# Patient Record
Sex: Male | Born: 1955 | Race: White | Hispanic: No | Marital: Married | Smoking: Current every day smoker
Health system: Southern US, Community
[De-identification: ages and names within clinical notes are randomized; demographics above are authoritative.]

## PROBLEM LIST (undated history)

## (undated) DIAGNOSIS — G8929 Other chronic pain: Secondary | ICD-10-CM

## (undated) DIAGNOSIS — F419 Anxiety disorder, unspecified: Secondary | ICD-10-CM

## (undated) DIAGNOSIS — M069 Rheumatoid arthritis, unspecified: Secondary | ICD-10-CM

## (undated) DIAGNOSIS — K2289 Other specified disease of esophagus: Secondary | ICD-10-CM

## (undated) DIAGNOSIS — I639 Cerebral infarction, unspecified: Secondary | ICD-10-CM

## (undated) DIAGNOSIS — J449 Chronic obstructive pulmonary disease, unspecified: Secondary | ICD-10-CM

## (undated) DIAGNOSIS — I999 Unspecified disorder of circulatory system: Secondary | ICD-10-CM

## (undated) DIAGNOSIS — I10 Essential (primary) hypertension: Secondary | ICD-10-CM

## (undated) DIAGNOSIS — M549 Dorsalgia, unspecified: Secondary | ICD-10-CM

## (undated) DIAGNOSIS — K228 Other specified diseases of esophagus: Secondary | ICD-10-CM

## (undated) DIAGNOSIS — R001 Bradycardia, unspecified: Secondary | ICD-10-CM

## (undated) HISTORY — PX: FOOT SURGERY: SHX648

## (undated) HISTORY — PX: STOMACH SURGERY: SHX791

## (undated) HISTORY — DX: Anxiety disorder, unspecified: F41.9

## (undated) HISTORY — DX: Unspecified disorder of circulatory system: I99.9

## (undated) HISTORY — DX: Bradycardia, unspecified: R00.1

## (undated) HISTORY — DX: Cerebral infarction, unspecified: I63.9

## (undated) HISTORY — DX: Other chronic pain: G89.29

## (undated) HISTORY — DX: Other specified diseases of esophagus: K22.8

## (undated) HISTORY — PX: BACK SURGERY: SHX140

## (undated) HISTORY — DX: Chronic obstructive pulmonary disease, unspecified: J44.9

## (undated) HISTORY — DX: Other chronic pain: M54.9

## (undated) HISTORY — DX: Other specified disease of esophagus: K22.89

## (undated) HISTORY — DX: Essential (primary) hypertension: I10

## (undated) HISTORY — PX: NASAL SINUS SURGERY: SHX719

## (undated) HISTORY — DX: Rheumatoid arthritis, unspecified: M06.9

---

## 2003-05-13 ENCOUNTER — Other Ambulatory Visit: Payer: Self-pay

## 2003-11-28 ENCOUNTER — Emergency Department: Payer: Self-pay | Admitting: Emergency Medicine

## 2003-11-29 ENCOUNTER — Emergency Department: Payer: Self-pay | Admitting: General Practice

## 2004-12-05 ENCOUNTER — Ambulatory Visit: Payer: Self-pay | Admitting: Pain Medicine

## 2004-12-11 ENCOUNTER — Ambulatory Visit: Payer: Self-pay | Admitting: Pain Medicine

## 2005-01-02 ENCOUNTER — Ambulatory Visit: Payer: Self-pay | Admitting: Pain Medicine

## 2005-01-08 ENCOUNTER — Ambulatory Visit: Payer: Self-pay | Admitting: Pain Medicine

## 2005-02-06 ENCOUNTER — Ambulatory Visit: Payer: Self-pay | Admitting: Pain Medicine

## 2005-02-14 ENCOUNTER — Ambulatory Visit: Payer: Self-pay | Admitting: Pain Medicine

## 2005-03-08 ENCOUNTER — Ambulatory Visit: Payer: Self-pay | Admitting: Pain Medicine

## 2005-03-10 ENCOUNTER — Emergency Department: Payer: Self-pay | Admitting: Emergency Medicine

## 2005-03-19 ENCOUNTER — Ambulatory Visit: Payer: Self-pay | Admitting: Pain Medicine

## 2005-04-05 ENCOUNTER — Ambulatory Visit: Payer: Self-pay | Admitting: Pain Medicine

## 2005-04-09 ENCOUNTER — Ambulatory Visit: Payer: Self-pay | Admitting: Pain Medicine

## 2005-04-26 ENCOUNTER — Other Ambulatory Visit: Payer: Self-pay

## 2005-05-03 ENCOUNTER — Inpatient Hospital Stay: Payer: Self-pay | Admitting: Unknown Physician Specialty

## 2005-05-04 ENCOUNTER — Other Ambulatory Visit: Payer: Self-pay

## 2005-05-14 ENCOUNTER — Ambulatory Visit: Payer: Self-pay | Admitting: Family Medicine

## 2006-03-28 ENCOUNTER — Other Ambulatory Visit: Payer: Self-pay

## 2006-03-28 ENCOUNTER — Inpatient Hospital Stay: Payer: Self-pay | Admitting: Internal Medicine

## 2006-04-05 ENCOUNTER — Ambulatory Visit: Payer: Self-pay | Admitting: Internal Medicine

## 2006-04-11 ENCOUNTER — Ambulatory Visit: Payer: Self-pay | Admitting: Internal Medicine

## 2006-04-18 ENCOUNTER — Ambulatory Visit: Payer: Self-pay | Admitting: Internal Medicine

## 2006-04-27 ENCOUNTER — Ambulatory Visit: Payer: Self-pay | Admitting: Internal Medicine

## 2006-05-24 ENCOUNTER — Emergency Department: Payer: Self-pay | Admitting: Emergency Medicine

## 2006-05-28 ENCOUNTER — Ambulatory Visit: Payer: Self-pay | Admitting: Internal Medicine

## 2006-06-27 ENCOUNTER — Ambulatory Visit: Payer: Self-pay | Admitting: Internal Medicine

## 2006-07-06 ENCOUNTER — Other Ambulatory Visit: Payer: Self-pay

## 2006-07-06 ENCOUNTER — Inpatient Hospital Stay: Payer: Self-pay | Admitting: Internal Medicine

## 2006-07-28 ENCOUNTER — Ambulatory Visit: Payer: Self-pay | Admitting: Internal Medicine

## 2006-08-02 ENCOUNTER — Ambulatory Visit: Payer: Self-pay | Admitting: Internal Medicine

## 2006-08-09 ENCOUNTER — Ambulatory Visit: Payer: Self-pay | Admitting: Internal Medicine

## 2006-08-27 ENCOUNTER — Ambulatory Visit: Payer: Self-pay | Admitting: Internal Medicine

## 2006-09-27 ENCOUNTER — Ambulatory Visit: Payer: Self-pay | Admitting: Internal Medicine

## 2007-02-24 ENCOUNTER — Emergency Department: Payer: Self-pay | Admitting: Emergency Medicine

## 2007-03-05 IMAGING — CR DG CHEST 1V PORT
1 series · 1 of 1 positions shown · non-contrast
Comparison: none

REASON FOR EXAM: Chest Pain
COMMENTS:

[view not recorded]
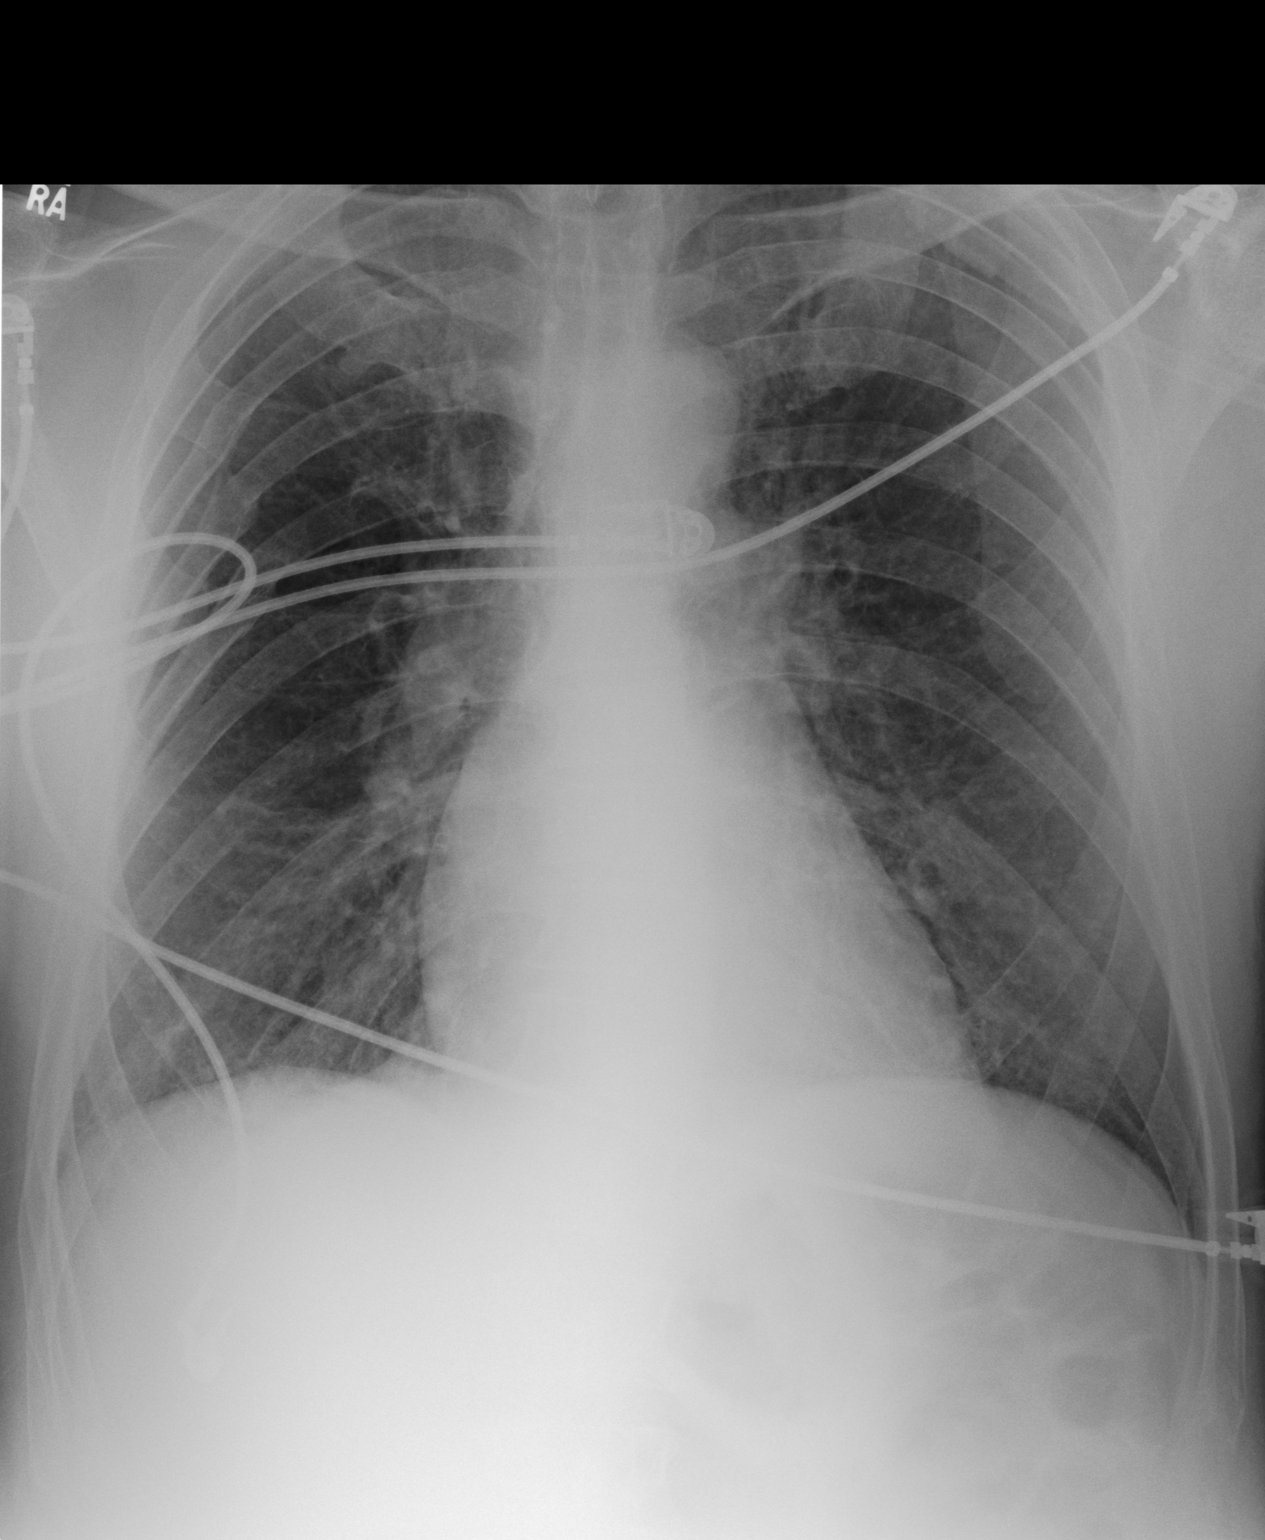

[1 of 1 positions shown; findings below may reference images not displayed]

PROCEDURE:     DXR - DXR PORTABLE CHEST SINGLE VIEW  - March 28, 2006  [DATE]

RESULT:     The mediastinum and hilar structures are normal.  The lungs are
clear. Cardiovascular structures are unremarkable. The previously identified
RIGHT lower lobe atelectasis and/or infiltrate on prior chest x-ray of
05-05-05 has cleared.
IMPRESSION: 1)No acute cardiopulmonary disease.

## 2007-03-30 ENCOUNTER — Ambulatory Visit: Payer: Self-pay | Admitting: Internal Medicine

## 2007-05-22 ENCOUNTER — Emergency Department: Payer: Self-pay | Admitting: Emergency Medicine

## 2007-06-14 ENCOUNTER — Emergency Department: Payer: Self-pay | Admitting: Unknown Physician Specialty

## 2007-06-14 ENCOUNTER — Other Ambulatory Visit: Payer: Self-pay

## 2008-02-01 IMAGING — CT CT CERVICAL SPINE WITHOUT CONTRAST
2 series · 15 of 20 positions shown, 18 images · non-contrast
Comparison: none

REASON FOR EXAM: neck pain  rm 14
COMMENTS:   LMP: (Male)

[Series 3: coronal · coronal · 0.54mm/px · 3 of 53 slices shown]
[im 11/53  bone]
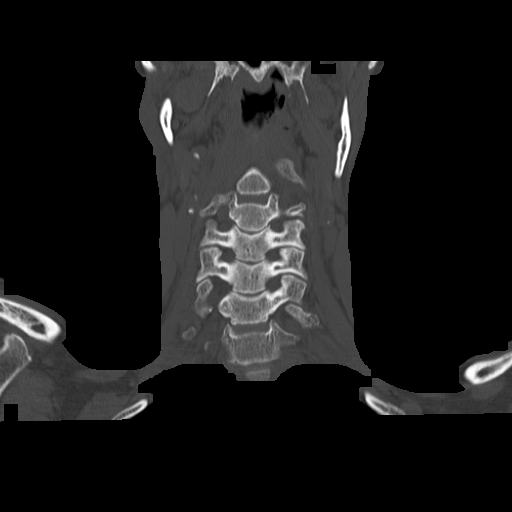
[im 21/53  bone]
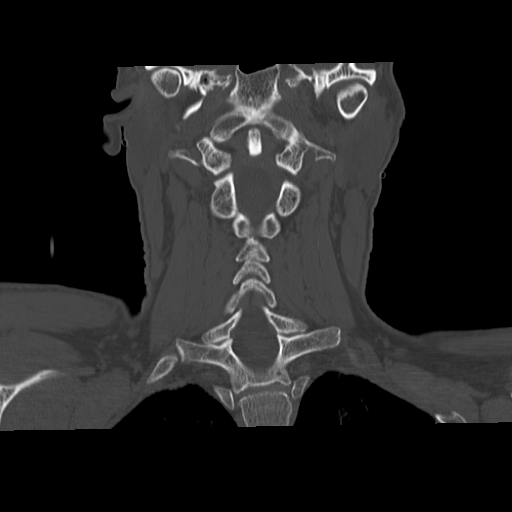
[im 32/53  bone]
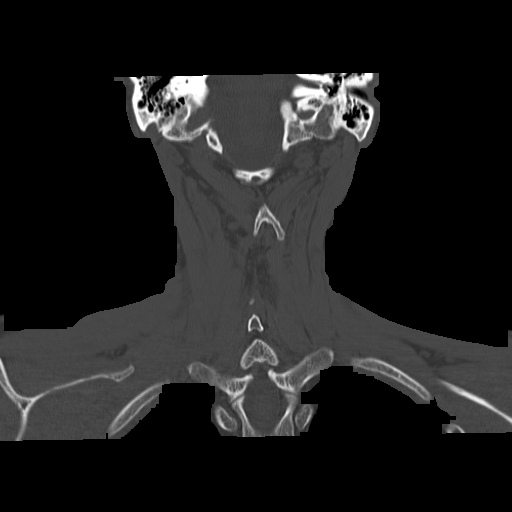

[Series 4: axial · axial · 0.32mm/px · z∈[+343,+483]mm · 12 of 84 slices shown, 15 images]
[im 7/84  soft-tissue]
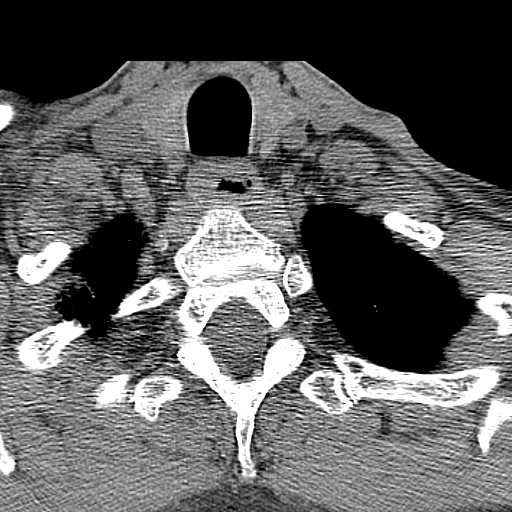
[im 7/84  bone]
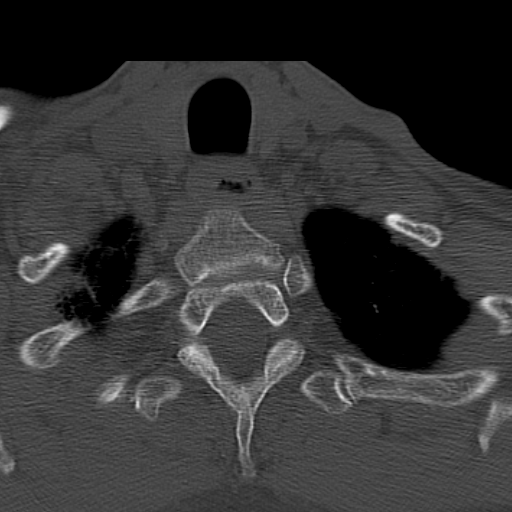
[im 13/84  bone]
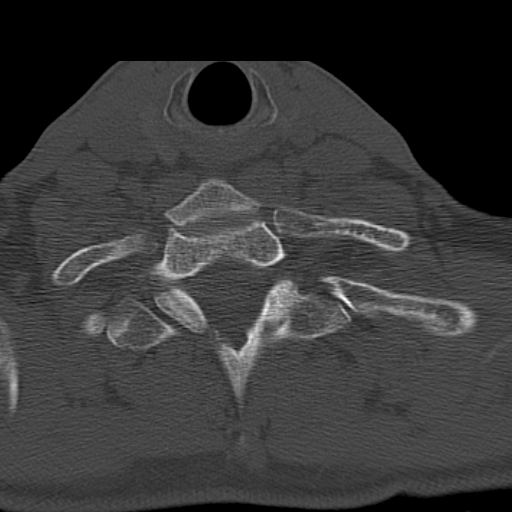
[im 20/84  bone]
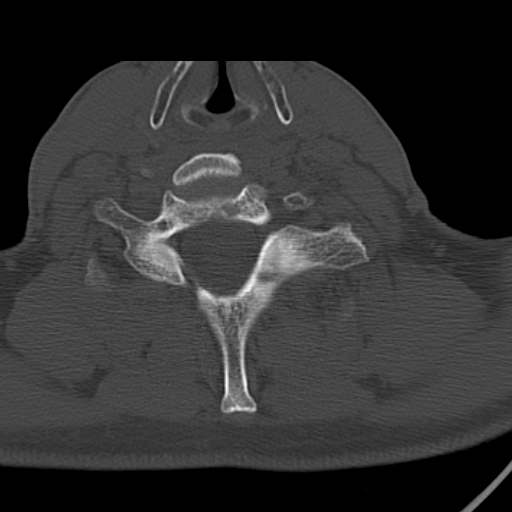
[im 26/84  bone]
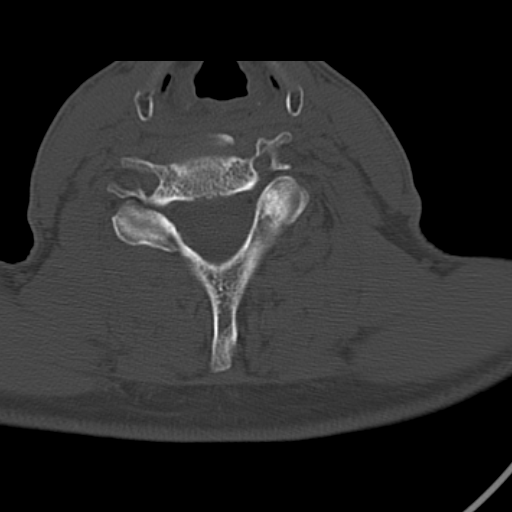
[im 32/84  soft-tissue]
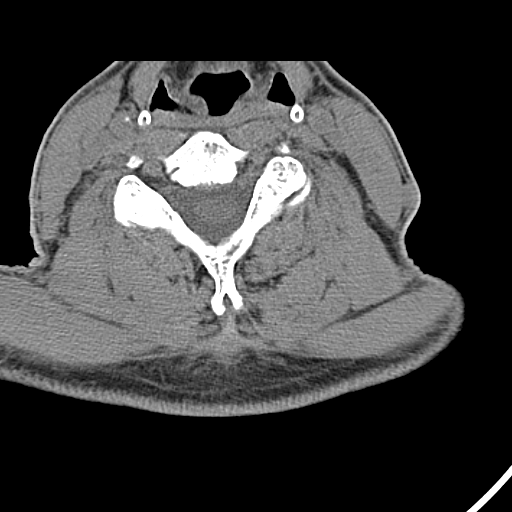
[im 32/84  bone]
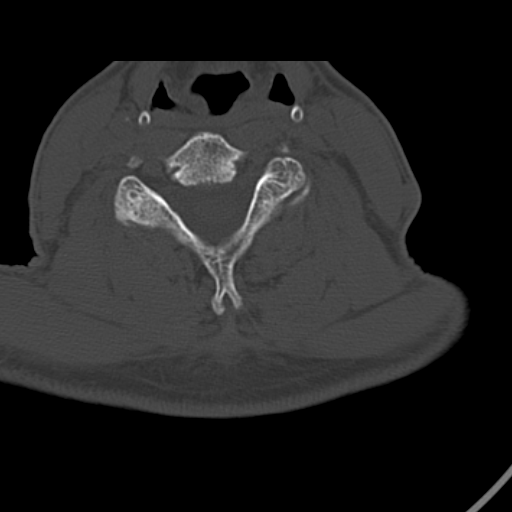
[im 39/84  bone]
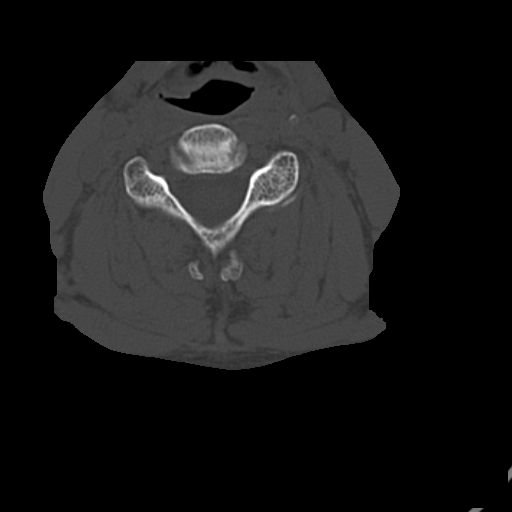
[im 45/84  bone]
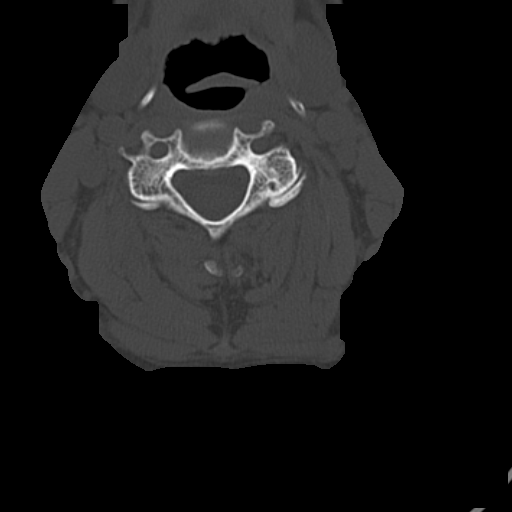
[im 52/84  bone]
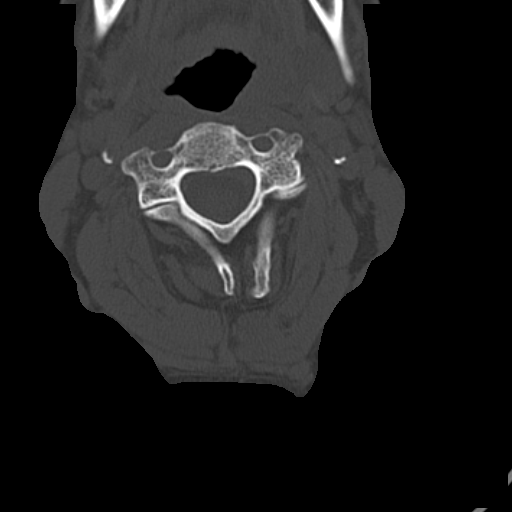
[im 58/84  soft-tissue]
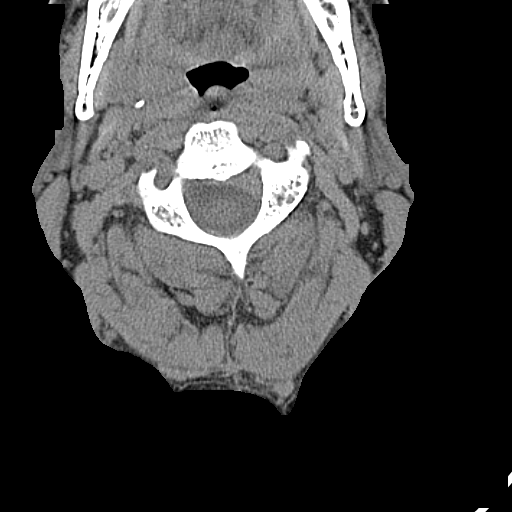
[im 58/84  bone]
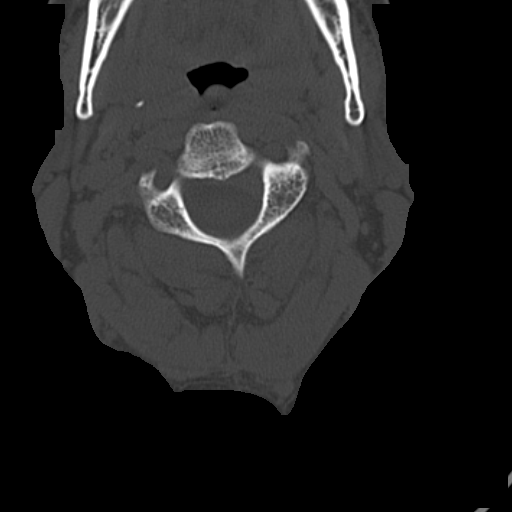
[im 64/84  bone]
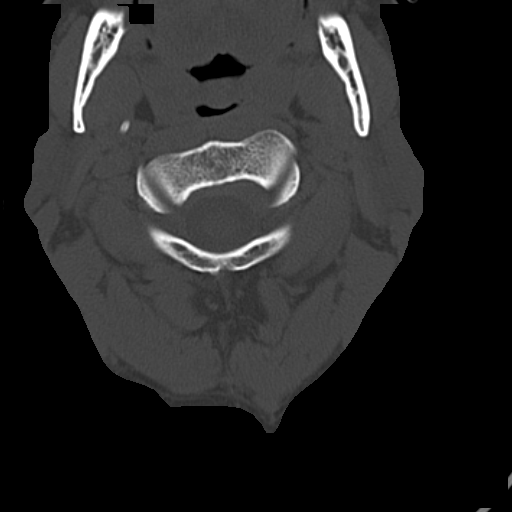
[im 71/84  bone]
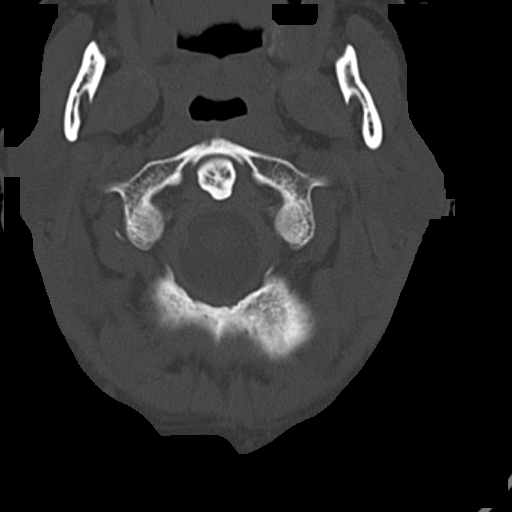
[im 77/84  bone]
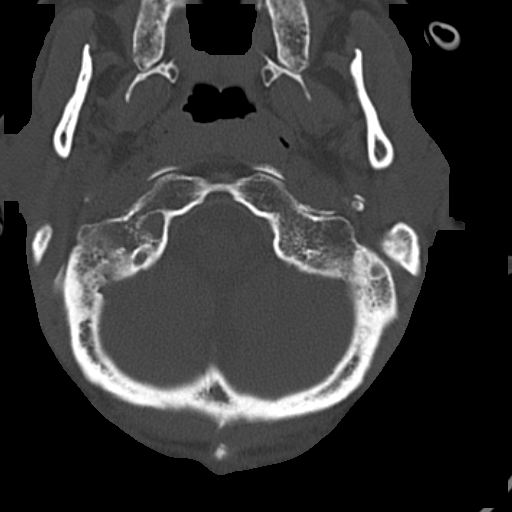

[15 of 20 positions shown; findings below may reference images not displayed]

PROCEDURE:     CT  - CT CERVICAL SPINE WO  - February 24, 2007  [DATE]

RESULT:     Multiplanar imaging of the cervical spine was obtained utilizing
a bone algorithm.
There is no evidence of fracture, dislocation or malalignment. Degenerative
changes are identified throughout the cervical spine.  There does not appear
to be evidence of focal blastic lesions within the cervical spine.  Small
focal lucent areas are identified within the facets of C4 and C5.  These
areas demonstrate sclerotic rims and likely represent the sequela of small
cysts secondary to degenerative change.  Similar facet changes are
demonstrated at multiple levels though to a lesser extent. There is no
evidence of prevertebral soft tissue swelling.  Limited evaluation of the
lung apices is unremarkable.
IMPRESSION: 1.     No evidence of focal or acute osseous abnormalities.   There appear
to be multiple levels of degenerative change and likely cysts within the
facets.  If there is persistent concern of neoplastic disease, further
evaluation with Nuclear Medicine lung scan and/or MRI is recommended if
clinically warranted.
2.     Dr. Towing of the Emergency Department was informed of these
findings via preliminary fax report on 02/24/07 at [DATE] a.m.

## 2008-02-10 ENCOUNTER — Ambulatory Visit: Payer: Self-pay | Admitting: Family Medicine

## 2008-03-09 ENCOUNTER — Ambulatory Visit: Payer: Self-pay | Admitting: Family Medicine

## 2008-06-01 ENCOUNTER — Ambulatory Visit: Payer: Self-pay | Admitting: Family Medicine

## 2009-01-18 ENCOUNTER — Emergency Department: Payer: Self-pay | Admitting: Emergency Medicine

## 2009-06-30 ENCOUNTER — Emergency Department: Payer: Self-pay | Admitting: Emergency Medicine

## 2010-02-08 ENCOUNTER — Inpatient Hospital Stay: Payer: Self-pay | Admitting: Internal Medicine

## 2010-02-09 ENCOUNTER — Ambulatory Visit: Payer: Self-pay | Admitting: Internal Medicine

## 2010-02-26 ENCOUNTER — Ambulatory Visit: Payer: Self-pay | Admitting: Internal Medicine

## 2010-11-16 ENCOUNTER — Emergency Department: Payer: Self-pay | Admitting: Emergency Medicine

## 2011-05-20 ENCOUNTER — Emergency Department: Payer: Self-pay | Admitting: Emergency Medicine

## 2011-05-20 LAB — COMPREHENSIVE METABOLIC PANEL
Alkaline Phosphatase: 102 U/L (ref 50–136)
Anion Gap: 9 (ref 7–16)
BUN: 5 mg/dL — ABNORMAL LOW (ref 7–18)
Calcium, Total: 9 mg/dL (ref 8.5–10.1)
Chloride: 105 mmol/L (ref 98–107)
Co2: 28 mmol/L (ref 21–32)
EGFR (African American): >60
EGFR (Non-African Amer.): >60
Glucose: 92 mg/dL (ref 65–99)
Osmolality: 280 (ref 275–301)
Potassium: 3.8 mmol/L (ref 3.5–5.1)
SGPT (ALT): 12 U/L
Sodium: 142 mmol/L (ref 136–145)
Total Protein: 7.5 g/dL (ref 6.4–8.2)

## 2011-05-20 LAB — CBC
MCHC: 33.9 g/dL (ref 32.0–36.0)
MCV: 92 fL (ref 80–100)
Platelet: 218 10*3/uL (ref 150–440)
RBC: 4.99 10*6/uL (ref 4.40–5.90)
RDW: 14 % (ref 11.5–14.5)

## 2011-05-20 LAB — TROPONIN I: Troponin-I: <0.02 ng/mL

## 2011-05-20 LAB — SEDIMENTATION RATE: Erythrocyte Sed Rate: 5 mm/hr (ref 0–20)

## 2011-07-17 ENCOUNTER — Emergency Department: Payer: Self-pay | Admitting: Internal Medicine

## 2011-07-17 LAB — COMPREHENSIVE METABOLIC PANEL WITH GFR
Albumin: 3.3 g/dL — ABNORMAL LOW
Alkaline Phosphatase: 104 U/L
Anion Gap: 9
BUN: 9 mg/dL
Bilirubin,Total: 0.2 mg/dL
Calcium, Total: 8.7 mg/dL
Chloride: 107 mmol/L
Co2: 25 mmol/L
Creatinine: 0.87 mg/dL
EGFR (African American): >60
EGFR (Non-African Amer.): >60
Glucose: 156 mg/dL — ABNORMAL HIGH
Osmolality: 283
Potassium: 3.7 mmol/L
SGOT(AST): 16 U/L
SGPT (ALT): 11 U/L — ABNORMAL LOW
Sodium: 141 mmol/L
Total Protein: 7.1 g/dL

## 2011-07-17 LAB — CBC
HCT: 43.4 %
HGB: 14.7 g/dL
MCH: 30.4 pg
MCHC: 33.9 g/dL
MCV: 90 fL
Platelet: 233 10*3/uL
RBC: 4.83 x10 6/mm 3
RDW: 14.5 %
WBC: 9.4 10*3/uL

## 2011-07-17 LAB — DRUG SCREEN, URINE
Amphetamines, Ur Screen: NEGATIVE
Barbiturates, Ur Screen: NEGATIVE
Benzodiazepine, Ur Scrn: NEGATIVE
Cannabinoid 50 Ng, Ur ~~LOC~~: POSITIVE
Cocaine Metabolite,Ur ~~LOC~~: NEGATIVE
MDMA (Ecstasy)Ur Screen: NEGATIVE
Methadone, Ur Screen: NEGATIVE
Opiate, Ur Screen: NEGATIVE
Phencyclidine (PCP) Ur S: NEGATIVE
Tricyclic, Ur Screen: NEGATIVE

## 2011-07-17 LAB — TROPONIN I: Troponin-I: <0.02 ng/mL

## 2011-07-17 LAB — CK TOTAL AND CKMB (NOT AT ARMC)
CK, Total: 67 U/L (ref 35–232)
CK-MB: 0.9 ng/mL (ref 0.5–3.6)

## 2011-07-19 ENCOUNTER — Inpatient Hospital Stay: Payer: Self-pay | Admitting: Internal Medicine

## 2011-07-19 LAB — COMPREHENSIVE METABOLIC PANEL
Albumin: 3.2 g/dL — ABNORMAL LOW (ref 3.4–5.0)
Bilirubin,Total: 0.2 mg/dL (ref 0.2–1.0)
Creatinine: 0.61 mg/dL (ref 0.60–1.30)
Glucose: 127 mg/dL — ABNORMAL HIGH (ref 65–99)
Osmolality: 282 (ref 275–301)
SGOT(AST): 14 U/L — ABNORMAL LOW (ref 15–37)
SGPT (ALT): 14 U/L

## 2011-07-19 LAB — URINALYSIS, COMPLETE
Bilirubin,UR: NEGATIVE
Blood: NEGATIVE
Ketone: NEGATIVE
Protein: NEGATIVE
RBC,UR: <1 /HPF (ref 0–5)
Specific Gravity: 1.012 (ref 1.003–1.030)
WBC UR: 1 /HPF (ref 0–5)

## 2011-07-19 LAB — CBC
HGB: 14.9 g/dL (ref 13.0–18.0)
MCH: 30.7 pg (ref 26.0–34.0)
MCHC: 34.2 g/dL (ref 32.0–36.0)
MCV: 90 fL (ref 80–100)
Platelet: 216 10*3/uL (ref 150–440)
WBC: 7 10*3/uL (ref 3.8–10.6)

## 2011-07-20 DIAGNOSIS — I379 Nonrheumatic pulmonary valve disorder, unspecified: Secondary | ICD-10-CM

## 2011-07-20 DIAGNOSIS — I498 Other specified cardiac arrhythmias: Secondary | ICD-10-CM

## 2011-07-20 LAB — CBC WITH DIFFERENTIAL/PLATELET
Basophil #: 0 10*3/uL (ref 0.0–0.1)
Basophil %: 0.6 %
Eosinophil #: 0.6 10*3/uL (ref 0.0–0.7)
HCT: 44.4 % (ref 40.0–52.0)
HGB: 14.4 g/dL (ref 13.0–18.0)
Lymphocyte #: 1.9 10*3/uL (ref 1.0–3.6)
MCH: 29.4 pg (ref 26.0–34.0)
MCHC: 32.4 g/dL (ref 32.0–36.0)
Monocyte #: 0.5 x10 3/mm (ref 0.2–1.0)
Neutrophil %: 56.2 %
Platelet: 212 10*3/uL (ref 150–440)
RDW: 14.6 % — ABNORMAL HIGH (ref 11.5–14.5)

## 2011-07-20 LAB — LIPID PANEL
HDL Cholesterol: 31 mg/dL — ABNORMAL LOW (ref 40–60)
Ldl Cholesterol, Calc: 106 mg/dL — ABNORMAL HIGH (ref 0–100)
Triglycerides: 158 mg/dL (ref 0–200)

## 2011-07-20 LAB — BASIC METABOLIC PANEL
BUN: 9 mg/dL (ref 7–18)
Chloride: 104 mmol/L (ref 98–107)
Co2: 28 mmol/L (ref 21–32)
Glucose: 145 mg/dL — ABNORMAL HIGH (ref 65–99)
Osmolality: 284 (ref 275–301)
Potassium: 3.8 mmol/L (ref 3.5–5.1)

## 2011-07-20 LAB — TROPONIN I: Troponin-I: <0.02 ng/mL

## 2011-07-24 ENCOUNTER — Telehealth: Payer: Self-pay | Admitting: Cardiovascular Disease

## 2011-07-24 NOTE — Telephone Encounter (Signed)
Do you recall seeing this pt at [facility]?  Does he need holter?

## 2011-07-24 NOTE — Telephone Encounter (Signed)
He needs a 48 hour Holter monitor then follow up with me after that.

## 2011-07-24 NOTE — Telephone Encounter (Signed)
Pt was told that he was to get a monitor before he left the hospital. Nurse told him that they didn't have an order for it. Can you check with Dr Kirke Corin and see if he wants him to still have the monitor and call pt. Told pt that if he still needs to wear it we can get the order and either have the monitor mailed to him or he can come here and get it put on.

## 2011-07-24 NOTE — Telephone Encounter (Signed)
Please schedule thx!

## 2011-07-25 ENCOUNTER — Encounter: Payer: Self-pay | Admitting: Cardiovascular Disease

## 2011-07-26 ENCOUNTER — Other Ambulatory Visit: Payer: Self-pay

## 2011-07-26 DIAGNOSIS — I495 Sick sinus syndrome: Secondary | ICD-10-CM

## 2011-07-26 DIAGNOSIS — R001 Bradycardia, unspecified: Secondary | ICD-10-CM

## 2011-08-09 ENCOUNTER — Encounter: Payer: Self-pay | Admitting: Cardiovascular Disease

## 2011-08-24 ENCOUNTER — Inpatient Hospital Stay: Payer: Self-pay | Admitting: Internal Medicine

## 2011-08-24 LAB — CBC
HCT: 44.2 % (ref 40.0–52.0)
MCHC: 33.7 g/dL (ref 32.0–36.0)
MCV: 91 fL (ref 80–100)
Platelet: 234 10*3/uL (ref 150–440)
RBC: 4.87 10*6/uL (ref 4.40–5.90)
WBC: 12.3 10*3/uL — ABNORMAL HIGH (ref 3.8–10.6)

## 2011-08-24 LAB — BASIC METABOLIC PANEL
Anion Gap: 6 — ABNORMAL LOW (ref 7–16)
Calcium, Total: 8.9 mg/dL (ref 8.5–10.1)
Chloride: 108 mmol/L — ABNORMAL HIGH (ref 98–107)
Co2: 29 mmol/L (ref 21–32)
Creatinine: 0.72 mg/dL (ref 0.60–1.30)
EGFR (African American): >60
Glucose: 90 mg/dL (ref 65–99)

## 2011-08-24 LAB — TROPONIN I: Troponin-I: <0.02 ng/mL

## 2011-08-28 DIAGNOSIS — I498 Other specified cardiac arrhythmias: Secondary | ICD-10-CM

## 2011-09-03 ENCOUNTER — Encounter: Payer: Medicaid Other | Admitting: Cardiovascular Disease

## 2011-10-02 ENCOUNTER — Inpatient Hospital Stay: Payer: Medicaid Other | Admitting: Pulmonary Disease

## 2011-10-02 ENCOUNTER — Encounter: Payer: Medicaid Other | Admitting: Cardiovascular Disease

## 2011-10-03 ENCOUNTER — Encounter: Payer: Self-pay | Admitting: Cardiovascular Disease

## 2012-07-08 LAB — DRUG SCREEN, URINE
Amphetamines, Ur Screen: NEGATIVE (ref ?–1000)
Benzodiazepine, Ur Scrn: NEGATIVE (ref ?–200)
Cannabinoid 50 Ng, Ur ~~LOC~~: NEGATIVE (ref ?–50)
Methadone, Ur Screen: NEGATIVE (ref ?–300)

## 2012-07-08 LAB — URINALYSIS, COMPLETE
Glucose,UR: NEGATIVE mg/dL (ref 0–75)
Ketone: NEGATIVE
Leukocyte Esterase: NEGATIVE
Nitrite: NEGATIVE
Ph: 7 (ref 4.5–8.0)
RBC,UR: 1 /HPF (ref 0–5)
Specific Gravity: 1.012 (ref 1.003–1.030)
Squamous Epithelial: 4
WBC UR: 1 /HPF (ref 0–5)

## 2012-07-08 LAB — COMPREHENSIVE METABOLIC PANEL
Alkaline Phosphatase: 98 U/L (ref 50–136)
Anion Gap: 6 — ABNORMAL LOW (ref 7–16)
BUN: 14 mg/dL (ref 7–18)
Calcium, Total: 9.4 mg/dL (ref 8.5–10.1)
Creatinine: 0.59 mg/dL — ABNORMAL LOW (ref 0.60–1.30)
Osmolality: 276 (ref 275–301)
SGPT (ALT): 11 U/L — ABNORMAL LOW (ref 12–78)
Sodium: 138 mmol/L (ref 136–145)
Total Protein: 7 g/dL (ref 6.4–8.2)

## 2012-07-08 LAB — ETHANOL
Ethanol %: <0.003 % (ref 0.000–0.080)
Ethanol: <3 mg/dL

## 2012-07-08 LAB — CBC
HCT: 45.3 % (ref 40.0–52.0)
HGB: 15.9 g/dL (ref 13.0–18.0)
MCHC: 35.1 g/dL (ref 32.0–36.0)
MCV: 88 fL (ref 80–100)
RBC: 5.14 10*6/uL (ref 4.40–5.90)
WBC: 12.1 10*3/uL — ABNORMAL HIGH (ref 3.8–10.6)

## 2012-07-09 ENCOUNTER — Inpatient Hospital Stay: Payer: Self-pay | Admitting: Psychiatry

## 2012-07-09 LAB — LIPASE, BLOOD: Lipase: 70 U/L — ABNORMAL LOW (ref 73–393)

## 2012-08-12 ENCOUNTER — Encounter: Payer: Self-pay | Admitting: *Deleted

## 2012-08-12 ENCOUNTER — Emergency Department: Payer: Self-pay | Admitting: Emergency Medicine

## 2012-08-12 LAB — COMPREHENSIVE METABOLIC PANEL
Albumin: 3.3 g/dL — ABNORMAL LOW (ref 3.4–5.0)
Alkaline Phosphatase: 93 U/L (ref 50–136)
Bilirubin,Total: 0.2 mg/dL (ref 0.2–1.0)
Calcium, Total: 9 mg/dL (ref 8.5–10.1)
Chloride: 106 mmol/L (ref 98–107)
Co2: 29 mmol/L (ref 21–32)
Creatinine: 0.72 mg/dL (ref 0.60–1.30)
EGFR (Non-African Amer.): >60
Osmolality: 279 (ref 275–301)
Potassium: 3.3 mmol/L — ABNORMAL LOW (ref 3.5–5.1)
Total Protein: 6.7 g/dL (ref 6.4–8.2)

## 2012-08-12 LAB — CBC
HCT: 42.3 % (ref 40.0–52.0)
MCH: 30.4 pg (ref 26.0–34.0)
MCV: 89 fL (ref 80–100)
Platelet: 229 10*3/uL (ref 150–440)
RBC: 4.75 10*6/uL (ref 4.40–5.90)
RDW: 14.1 % (ref 11.5–14.5)

## 2012-08-12 LAB — CK TOTAL AND CKMB (NOT AT ARMC): CK-MB: 1.3 ng/mL (ref 0.5–3.6)

## 2012-08-12 LAB — TROPONIN I: Troponin-I: <0.02 ng/mL

## 2012-08-14 ENCOUNTER — Encounter: Payer: Self-pay | Admitting: *Deleted

## 2012-08-15 ENCOUNTER — Ambulatory Visit: Payer: Medicaid Other | Admitting: Cardiovascular Disease

## 2012-08-28 ENCOUNTER — Inpatient Hospital Stay: Payer: Self-pay | Admitting: Family Medicine

## 2012-08-28 DIAGNOSIS — I4891 Unspecified atrial fibrillation: Secondary | ICD-10-CM

## 2012-08-28 DIAGNOSIS — I10 Essential (primary) hypertension: Secondary | ICD-10-CM

## 2012-08-28 LAB — CBC WITH DIFFERENTIAL/PLATELET
Basophil #: 0.1 10*3/uL (ref 0.0–0.1)
Eosinophil #: 0.6 10*3/uL (ref 0.0–0.7)
HCT: 46.5 % (ref 40.0–52.0)
HGB: 15.8 g/dL (ref 13.0–18.0)
Lymphocyte #: 1.6 10*3/uL (ref 1.0–3.6)
Lymphocyte %: 8.4 %
MCHC: 34 g/dL (ref 32.0–36.0)
RDW: 16 % — ABNORMAL HIGH (ref 11.5–14.5)
WBC: 19.5 10*3/uL — ABNORMAL HIGH (ref 3.8–10.6)

## 2012-08-28 LAB — COMPREHENSIVE METABOLIC PANEL
Alkaline Phosphatase: 106 U/L (ref 50–136)
BUN: 10 mg/dL (ref 7–18)
Bilirubin,Total: 0.3 mg/dL (ref 0.2–1.0)
EGFR (Non-African Amer.): >60
Glucose: 135 mg/dL — ABNORMAL HIGH (ref 65–99)
Osmolality: 286 (ref 275–301)
SGOT(AST): 13 U/L — ABNORMAL LOW (ref 15–37)
SGPT (ALT): 23 U/L (ref 12–78)
Sodium: 143 mmol/L (ref 136–145)
Total Protein: 6.7 g/dL (ref 6.4–8.2)

## 2012-08-28 LAB — URINALYSIS, COMPLETE
Bilirubin,UR: NEGATIVE
Leukocyte Esterase: NEGATIVE
Nitrite: NEGATIVE
Protein: NEGATIVE
RBC,UR: 1 /HPF (ref 0–5)
Specific Gravity: 1.055 (ref 1.003–1.030)
Squamous Epithelial: 2

## 2012-08-28 LAB — PROTIME-INR: INR: 1

## 2012-08-28 LAB — TROPONIN I
Troponin-I: 0.02 ng/mL
Troponin-I: <0.02 ng/mL

## 2012-08-29 LAB — CBC WITH DIFFERENTIAL/PLATELET
Basophil #: 0.1 10*3/uL (ref 0.0–0.1)
Basophil %: 0.4 %
Eosinophil #: 0 10*3/uL (ref 0.0–0.7)
Eosinophil %: 0 %
HCT: 42.6 % (ref 40.0–52.0)
HGB: 14.3 g/dL (ref 13.0–18.0)
Lymphocyte #: 0.6 10*3/uL — ABNORMAL LOW (ref 1.0–3.6)
MCH: 30.6 pg (ref 26.0–34.0)
MCHC: 33.6 g/dL (ref 32.0–36.0)
Monocyte %: 2.8 %
Neutrophil %: 93.1 %
Platelet: 184 10*3/uL (ref 150–440)
WBC: 16.4 10*3/uL — ABNORMAL HIGH (ref 3.8–10.6)

## 2012-08-30 LAB — BASIC METABOLIC PANEL
Anion Gap: 8 (ref 7–16)
BUN: 18 mg/dL (ref 7–18)
Chloride: 104 mmol/L (ref 98–107)
Creatinine: 0.84 mg/dL (ref 0.60–1.30)
EGFR (African American): >60
Glucose: 133 mg/dL — ABNORMAL HIGH (ref 65–99)
Osmolality: 281 (ref 275–301)
Sodium: 139 mmol/L (ref 136–145)

## 2012-08-31 LAB — CBC WITH DIFFERENTIAL/PLATELET
Basophil %: 0.4 %
Eosinophil #: 0 10*3/uL (ref 0.0–0.7)
Eosinophil %: 0.1 %
HCT: 40.2 % (ref 40.0–52.0)
HGB: 14 g/dL (ref 13.0–18.0)
Lymphocyte #: 1.1 10*3/uL (ref 1.0–3.6)
MCH: 31.7 pg (ref 26.0–34.0)
MCHC: 34.8 g/dL (ref 32.0–36.0)
MCV: 91 fL (ref 80–100)
Monocyte #: 0.8 x10 3/mm (ref 0.2–1.0)
Monocyte %: 8.9 %
Neutrophil #: 7.3 10*3/uL — ABNORMAL HIGH (ref 1.4–6.5)
Neutrophil %: 78.5 %
Platelet: 187 10*3/uL (ref 150–440)
RBC: 4.41 10*6/uL (ref 4.40–5.90)
RDW: 15.8 % — ABNORMAL HIGH (ref 11.5–14.5)
WBC: 9.2 10*3/uL (ref 3.8–10.6)

## 2012-09-01 DIAGNOSIS — I4891 Unspecified atrial fibrillation: Secondary | ICD-10-CM

## 2012-09-15 ENCOUNTER — Encounter: Payer: Medicaid Other | Admitting: Physician Assistant

## 2012-09-17 ENCOUNTER — Encounter: Payer: Medicaid Other | Admitting: Physician Assistant

## 2012-09-18 ENCOUNTER — Ambulatory Visit (INDEPENDENT_AMBULATORY_CARE_PROVIDER_SITE_OTHER): Payer: Medicaid Other | Admitting: Physician Assistant

## 2012-09-18 ENCOUNTER — Encounter: Payer: Self-pay | Admitting: Physician Assistant

## 2012-09-18 VITALS — BP 106/74 | HR 62 | Ht 73.0 in | Wt 173.8 lb

## 2012-09-18 DIAGNOSIS — I498 Other specified cardiac arrhythmias: Secondary | ICD-10-CM

## 2012-09-18 DIAGNOSIS — R002 Palpitations: Secondary | ICD-10-CM | POA: Insufficient documentation

## 2012-09-18 DIAGNOSIS — I4891 Unspecified atrial fibrillation: Secondary | ICD-10-CM

## 2012-09-18 DIAGNOSIS — J449 Chronic obstructive pulmonary disease, unspecified: Secondary | ICD-10-CM

## 2012-09-18 DIAGNOSIS — Z720 Tobacco use: Secondary | ICD-10-CM

## 2012-09-18 DIAGNOSIS — I480 Paroxysmal atrial fibrillation: Secondary | ICD-10-CM | POA: Insufficient documentation

## 2012-09-18 DIAGNOSIS — I471 Supraventricular tachycardia: Secondary | ICD-10-CM

## 2012-09-18 DIAGNOSIS — F17200 Nicotine dependence, unspecified, uncomplicated: Secondary | ICD-10-CM

## 2012-09-18 MED ORDER — NICOTINE 21 MG/24HR TD PT24: 1.0000 | MEDICATED_PATCH | TRANSDERMAL | Status: AC

## 2012-09-18 MED ORDER — DILTIAZEM HCL 30 MG PO TABS: 30.0000 mg | ORAL_TABLET | Freq: Four times a day (QID) | ORAL | Status: DC

## 2012-09-18 MED ORDER — NICOTINE 14 MG/24HR TD PT24: 1.0000 | MEDICATED_PATCH | TRANSDERMAL | Status: AC

## 2012-09-18 MED ORDER — NICOTINE 7 MG/24HR TD PT24: 1.0000 | MEDICATED_PATCH | TRANSDERMAL | Status: AC

## 2012-09-18 NOTE — Assessment & Plan Note (Addendum)
There is some wheezing on close auscultation. O2 sat 96% on RA in the office today. Continue current COPD management regimen. He was advised to resume his follow-up appointment in 2 weeks with his PCP who manages this.

## 2012-09-18 NOTE — Patient Instructions (Addendum)
Please take Cardizem (diltiazem) short acting, 60 mg (one tablet) twice a day.   Please continue to take digoxin and Xarelto as prescribed.   We will obtain labwork today to check your digoxin level.   Please try nicotine patches and e-cigarettes as a means of quitting tobacco.   Continue to follow-up with your primary care provider and address pain concerns with her.   Continue your current COPD management regimen.

## 2012-09-18 NOTE — Assessment & Plan Note (Signed)
Switch from long-acting to short-acting diltiazem 30mg  q6hr and PRN for palpitations. Continue digoxin. Will check digoxin level today. Continue Xarelto for anticoagulation. No bleeding problems.

## 2012-09-18 NOTE — Progress Notes (Signed)
Patient ID: Brandon Moreno, male   DOB: Aug 08, 1955, 57 y.o.   MRN: [id number]            Date:  09/18/2012   ID:  Brandon Moreno, DOB 10/27/1955, MRN [id number]  PCP:  Hyman Hopes, MD  Primary Cardiologist:  Seen in consultation at [facility] by M. Kirke Corin, MD   History of Present Illness:  Brandon Moreno is a 57 y.o. 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   He had recently been discharged from [facility] in 06/2012 for psychosis treated with Haldol. He has since been under hospice care (home health). There was documentation in Epic to arrange for Holter monitoring, the indications for which were unclear. The wife reported he had an episode of palpitations, HR ~ 200 bpm responsive to adenosine, and had since had intermittent tachy-palpitations for the past year.   He had experienced dyspnea, wheezing, cough and palpitations for which he presented to [facility] ED yesterday. CXR and chest CT revealed findings consistent with chronic empysema and question of acute bronchitis. EKG and telemetry confirmed atrial fibrillation with RVR (153 bpm), significant motion artifact. Cardiac markers WNL. BNP elevated at 2280. Underlying severe lung disease was suspected to provide the substrate for atrial tachy-arrhythmias, and the patient's COPD exacerbation was defined as the inciting event. He was started on diltiazem gtt with good response. He was started on Xarelto given CHADSVASc of 3. Symptoms improved on antibiotics. HR did elevate (~130s) with activity once he was able to ambulate. Digoxin was added with improvement.   He did have an episode of transient bradycardia (HR 30s) just prior to discharge. Dr. Mordecai Maes discussed this finding with Dr. Kirke Corin who advised to continue current  therapy with close eye on HR. He was advised to follow-up in the office.   His wife is with him today. He reports feeling generally well since discharge. Breathing has been at his baseline- dyspneic on crossing a room. He does continue to smoke cigarettes. He continues to wear O2 only at night. He has been adhering to a regimen of albuterol, theophylline, advair and spiriva. He has follow-up with his PCP 09/29/12 who manages his COPD. He does report persistent, intermittent tachy-palpitations with associated lightheadedness and presyncope. He has continues to take diltiazem ER 120 mg daily, digoxin and Xarelto post-discharge. He denies chest pain or syncope. He denies PND, orthopnea or LE edema. No active bleeding, cough, fevers or chills.  EKG: NSR, 62 bpm, no ST/T changes  Wt Readings from Last 3 Encounters:  09/18/12 78.812 kg (173 lb 12 oz)     Past Medical History  Diagnosis Date  . COPD (chronic obstructive pulmonary disease)   . Asthma   . Anxiety   . Bradycardia   . Rheumatoid arthritis(714.0)   . Esophageal dilatation   . Chronic back pain   . Bradycardia   . Vascular disease     hx  . Stroke     hx  . Hypertension     Hx    Current Outpatient Prescriptions  Medication Sig Dispense Refill  . albuterol (PROVENTIL HFA;VENTOLIN HFA) 108 (90 BASE) MCG/ACT inhaler Inhale 2 puffs into the lungs every 6 (six) hours as needed.      Marland Kitchen aspirin 81 MG tablet Take 81 mg by  mouth daily.      . Fluticasone-Salmeterol (ADVAIR) 250-50 MCG/DOSE AEPB Inhale 1 puff into the lungs 2 (two) times daily.      Marland Kitchen omeprazole (PRILOSEC) 40 MG capsule Take 40 mg by mouth daily.      Marland Kitchen tiotropium (SPIRIVA) 18 MCG inhalation capsule Place 18 mcg into inhaler and inhale daily.       No current facility-administered medications for this visit.    Allergies:    Allergies  Allergen Reactions  . Codeine   . Levaquin (Levofloxacin In D5w)   . Lodine (Etodolac)   . Neurontin (Gabapentin)   . Ultram  (Tramadol)     Social History:  The patient  reports that he has been smoking Cigarettes.  He has a 20 pack-year smoking history. He does not have any smokeless tobacco history on file. He reports that he uses illicit drugs (Marijuana). He reports that he does not drink alcohol.   Family History:  Family History  Problem Relation Age of Onset  . Heart failure Father   . Heart attack Sister     Review of Systems: General: negative for chills, fever, night sweats or weight changes.  Cardiovascular: positive for palpitations, negative for chest pain, dyspnea on exertion, edema, orthopnea, paroxysmal nocturnal dyspnea or shortness of breath Dermatological: negative for rash Respiratory: positive for wheezing Urologic: negative for hematuria Abdominal: negative for nausea, vomiting, diarrhea, bright red blood per rectum, melena, or hematemesis Neurologic: positive for lightheadedness, negative for visual changes, syncope All other systems reviewed and are otherwise negative except as noted above.  PHYSICAL EXAM: VS:  BP 106/74  Pulse 62  Ht 6\' 1"  (1.854 m)  Wt 78.812 kg (173 lb 12 oz)  BMI 22.93 kg/m2 Thin Caucasian male appearing older than stated age in no acute distress HEENT: normal, PERRL Neck: no JVD or bruits Cardiac: distant heart sounds, normal S1, S2; RRR; no murmur or gallops Lungs: diffuse centralized wheezing, no appreciable rales or rhonchi Abd: soft, nontender, no hepatomegaly, normoactive BS x 4 quads Ext: no edema, cyanosis or clubbing Skin: warm and dry, cap refill < 2 sec Neuro:  CNs 2-12 intact, no focal abnormalities noted Musculoskeletal: strength and tone appropriate for age  Psych: normal affect

## 2012-09-18 NOTE — Assessment & Plan Note (Signed)
Smoking cessation stressed on today's visit. We spoke at length about strategies to quit. He is willing to try a nicotine patch taper as a means of NRT for cessation assistance. Spoke to his wife regarding her cessation as well in order to avoid temptation.

## 2012-09-18 NOTE — Assessment & Plan Note (Signed)
The patient has a history of PAF and SVT. A-fib with RVR was documented on recent admission. He converted and sustained NSR on a regimen of diltiazem PO and digoxin. Xarelto was started for anticoagulation. He continues to have intermittent tachy-palpitations with associated lightheadedness. Will increase frequency of rate-control. Switch long acting diltiazem to 30mg  short acting q6hr and PRN dosing for palpitations. He was advised to monitor his BP and HR with this change (borderline hypotensive and bradycardic just prior to discharge from [facility] recently).

## 2012-09-19 LAB — DIGOXIN LEVEL: Digoxin Level: 0.5 ng/mL — ABNORMAL LOW (ref 0.9–2.0)

## 2012-12-19 ENCOUNTER — Ambulatory Visit: Payer: Medicaid Other | Admitting: Cardiovascular Disease

## 2013-01-02 ENCOUNTER — Ambulatory Visit: Payer: Medicaid Other | Admitting: Cardiovascular Disease

## 2013-08-25 ENCOUNTER — Other Ambulatory Visit: Payer: Self-pay | Admitting: *Deleted

## 2013-08-25 DIAGNOSIS — I471 Supraventricular tachycardia: Secondary | ICD-10-CM

## 2013-08-25 DIAGNOSIS — R002 Palpitations: Secondary | ICD-10-CM

## 2013-08-25 DIAGNOSIS — I480 Paroxysmal atrial fibrillation: Secondary | ICD-10-CM

## 2013-08-25 MED ORDER — DILTIAZEM HCL 30 MG PO TABS: 30.0000 mg | ORAL_TABLET | Freq: Four times a day (QID) | ORAL | Status: AC

## 2013-08-25 NOTE — Telephone Encounter (Signed)
Requested Prescriptions   Signed Prescriptions Disp Refills  . diltiazem (CARDIZEM) 30 MG tablet 120 tablet 2    Sig: Take 1 tablet (30 mg total) by mouth 4 (four) times daily.    Authorizing Provider: Lorine BearsARIDA, MUHAMMAD A    Ordering User: Kendrick FriesLOPEZ, MARINA C

## 2013-08-26 ENCOUNTER — Emergency Department: Payer: Self-pay | Admitting: Emergency Medicine

## 2013-08-26 LAB — BASIC METABOLIC PANEL
ANION GAP: 7 (ref 7–16)
BUN: 5 mg/dL — ABNORMAL LOW (ref 7–18)
CHLORIDE: 107 mmol/L (ref 98–107)
CREATININE: 0.74 mg/dL (ref 0.60–1.30)
Calcium, Total: 9.6 mg/dL (ref 8.5–10.1)
Co2: 25 mmol/L (ref 21–32)
EGFR (Non-African Amer.): >60
Glucose: 89 mg/dL (ref 65–99)
Osmolality: 274 (ref 275–301)
Potassium: 3.7 mmol/L (ref 3.5–5.1)
Sodium: 139 mmol/L (ref 136–145)

## 2013-08-26 LAB — CBC
HCT: 47.8 % (ref 40.0–52.0)
HGB: 16.4 g/dL (ref 13.0–18.0)
MCH: 30.1 pg (ref 26.0–34.0)
MCHC: 34.3 g/dL (ref 32.0–36.0)
MCV: 88 fL (ref 80–100)
PLATELETS: 214 10*3/uL (ref 150–440)
RBC: 5.44 10*6/uL (ref 4.40–5.90)
RDW: 15.1 % — AB (ref 11.5–14.5)
WBC: 7.7 10*3/uL (ref 3.8–10.6)

## 2013-08-26 LAB — HEPATIC FUNCTION PANEL A (ARMC)
AST: 19 U/L (ref 15–37)
Albumin: 3.5 g/dL (ref 3.4–5.0)
Alkaline Phosphatase: 122 U/L — ABNORMAL HIGH
BILIRUBIN TOTAL: 0.3 mg/dL (ref 0.2–1.0)
Bilirubin, Direct: <0.1 mg/dL (ref 0.00–0.20)
SGPT (ALT): 13 U/L (ref 12–78)
TOTAL PROTEIN: 7.4 g/dL (ref 6.4–8.2)

## 2013-08-26 LAB — TROPONIN I

## 2013-08-26 LAB — CK TOTAL AND CKMB (NOT AT ARMC)
CK, Total: 55 U/L
CK-MB: 0.7 ng/mL (ref 0.5–3.6)

## 2013-08-26 LAB — PROTIME-INR
INR: 1.6
PROTHROMBIN TIME: 18.8 s — AB (ref 11.5–14.7)

## 2013-11-26 ENCOUNTER — Other Ambulatory Visit: Payer: Self-pay

## 2013-11-26 DIAGNOSIS — R002 Palpitations: Secondary | ICD-10-CM

## 2013-11-26 DIAGNOSIS — I471 Supraventricular tachycardia: Secondary | ICD-10-CM

## 2013-11-26 DIAGNOSIS — I480 Paroxysmal atrial fibrillation: Secondary | ICD-10-CM

## 2014-04-25 ENCOUNTER — Inpatient Hospital Stay: Payer: Self-pay | Admitting: Internal Medicine

## 2014-04-26 ENCOUNTER — Ambulatory Visit: Payer: Self-pay | Admitting: Internal Medicine

## 2014-04-27 ENCOUNTER — Ambulatory Visit
Admit: 2014-04-27 | Disposition: A | Discharge status: Home / Self Care | Payer: Self-pay | Attending: Internal Medicine | Admitting: Internal Medicine

## 2014-05-28 ENCOUNTER — Ambulatory Visit
Admit: 2014-05-28 | Disposition: A | Discharge status: Home / Self Care | Payer: Self-pay | Attending: Internal Medicine | Admitting: Internal Medicine

## 2014-05-28 DEATH — deceased

## 2014-06-18 NOTE — H&P (Signed)
PATIENT NAME:  Brandon Moreno, Brandon Moreno MR#:  [id number] DATE OF BIRTH:  May 18, 1955  DATE OF ADMISSION:  07/08/2012  IDENTIFYING INFORMATION AND CHIEF COMPLAINT: A 59 year old man with a history of strokes and chronic obstructive pulmonary disease and a history of psychotic symptoms, which had previously been stabilized. He is committed involuntarily at the request of his wife because of increasing paranoid behavior at home.   CHIEF COMPLAINT: "Something in my brain isn't working."   HISTORY OF PRESENT ILLNESS: Information obtained from the patient and the chart. The patient's wife reports that he has been off of his antipsychotic medication probably for a couple of months. He has had a gradual progression and psychotic behavior. In the last few days, he has started making bizarre paranoid comments about television shows; some of which were vaguely threatening. He has become increasingly agitated and confused. Wife feels that she can no longer take care of him. He had previously been prescribed his antipsychotic by his primary care doctor. According to both the wife and the patient, the primary care doctor for some reason is now declining to continue that medication. He has also been off his Aricept. The patient denies any alcohol or drug abuse other than smoking. There does not appear to have been in any specific new stressor otherwise. No other change in medication.   PAST PSYCHIATRIC HISTORY: The patient has been identified for several years as having psychotic symptoms which seemed to have started late in life and may be related to multiple strokes. Neurology consult by Dr. Chestine Spore some years ago suggested this. His thought disorder and paranoia had improved when he was treated with a low dose of haloperidol. I have seeing doses ranging only between 1 and 2 mg a day quoted in the chart. The patient denies any history of violence towards others. He tells me that he has had a suicide attempt years ago, but he does  not elaborate on it. He denies to me that he has had psychiatric hospitalizations.   PAST MEDICAL HISTORY: The patient has chronic obstructive pulmonary disease, hypertension, history of vascular disease, identified history of strokes in the past, gastric reflux symptoms.   CURRENT MEDICATIONS: I found 2 different lists to use for reconciliation as best as I can discover, it appears to be Spiriva 1 capsule inhaled daily, Prilosec 20 mg p.o. b.i.d., Advair Diskus 250/50 inhaler 1 puff twice a day, Aggrenox 1 capsule b.i.d., albuterol inhaler 2 puffs q. 4 hours p.r.n. for shortness of breath.   ALLERGIES: CODEINE, LEVAQUIN, LODINE, NEURONTIN, ULTRAM.   SOCIAL HISTORY: The patient lives with his wife and his wife's mother. The patient says that he feels like his relationship at home with his wife is fine. He is on disability. Average day is spent watching television and doing not much.   FAMILY HISTORY: No family history of mental illness identified.   REVIEW OF SYSTEMS: The patient says that he feels like his brain is not working right. He tells me he does hear his name being called from the television set. He does admit that his mood has been more angry and depressed recently. He says he has had some thoughts about wanting to die and wanting to kill himself. He has been sleeping poorly. He minimizes his physical symptoms.   MENTAL STATUS EXAMINATION: Disheveled gentleman who looks much older than his stated age. He is cooperative for the most part with the interview. Eye contact is intermittent. Psychomotor activity a bit fidgety but not agitated. Speech  decreased in total amount. Generally easy to understand. Affect is flat. Mood is stated as being bad. Thoughts are a little bit scattered, not grossly disorganized. He endorses feeling paranoid much of the time. He tells me he does hear his name being called from the television. He endorses some suicidal ideas without any intent or plan. No homicidal  ideation. The patient is alert and oriented. I did not do further cognitive testing, but he appears to be quite slow. He has adequate insight and judgment concerning the level of treatment right now.   PHYSICAL EXAMINATION: GENERAL: The patient again, looks older than his stated age and appears chronically ill. His fingers, especially on his left hand are stained very dark brown in a manner typical of a very heavy smoker with inadequate hygiene. No other acute skin lesions identified.  HEENT: Pupils equal and reactive. Face symmetric. The patient is nearly or completely edentulous, it was hard to determine.Marland Kitchen.  NEUROLOGIC: His strength is symmetric throughout. Gait is a little bit wide based, but he does not look like he is in risk of falling over.  LUNGS: Clear throughout without wheezes.  HEART: Regular rate and rhythm.  ABDOMEN: Normal bowel sounds. The patient had a constant movement of his mouth that could be tardive dyskinesia or could be more of the effect of being edentulous.   VITAL SIGNS: Temperature 98.1, pulse 62, respirations 16, blood pressure 123/69.   LABORATORY RESULTS: Drug screen entirely negative. Lipase low normal at 70. Alcohol not detected. Chemistry panel: Really no remarkable findings. Creatinine 0.59. Hematology panel: Very slightly elevated white count at 12.1. Urinalysis unremarkable.   ASSESSMENT: A 59 year old man who has chronic psychotic symptoms, previously well controlled on Haldol who has been off his medicine for a few months. He has had predictable progression in his psychosis with delusions, paranoia, hallucinations, disorganized thinking and disorganized behavior that it becomes intolerable at home. The patient does have adequate insight and is agreeable to admission.   TREATMENT PLAN: Admit to psychiatry. Continue usual medications as best we can from an outpatient. Restart haloperidol 1 mg twice a day. Also restart Aricept which he had been taking previously at  10 mg a day. Engage patient in appropriate groups and therapy. Try and get in contact with his wife. Monitor progression of symptoms.   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   AXIS II: No diagnosis.   AXIS III: Chronic obstructive pulmonary disease, history of coronary artery disease, history of multiple strokes.   AXIS IV: A moderate to severe from worsening of his illness, limited resources.   AXIS V: Functioning at time of evaluation 35.    ____________________________ Audery AmelJohn T. Gyan Cambre, MD jtc:cc D: 07/09/2012 21:23:23 ET T: 07/09/2012 22:01:49 ET JOB#: [id number]  cc: Audery AmelJohn T. Analilia Geddis, MD, <Dictator> Audery AmelJOHN T Eldene Plocher MD ELECTRONICALLY SIGNED 07/10/2012 14:23

## 2014-06-18 NOTE — Consult Note (Signed)
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   He was recently [facility] in 06/2012 for psychosis treated with Haldol. He has since been under hospice care (home health). There is a note in Epic to arrange for OP 48 hr Holter monitor post-discharge; however, the indications for which are unclear. Speaking to the wife, the patient has had a history of tachy-palpitations dating back to last year when he was diagnosed with TIA. She states he reported palpitations and presyncope at that time. HR > 200 bpm on EMS arrival, and she described what sounds like an adenosine administration which terminated the rhythm. He has since experiencing intermittent tachy-palpitations.   Over the past two days, he exhibited increased dyspnea, wheezing, cough and palpitations for which he presented [facility] ED yesterday.   Present Illness In the ED, EKG and telemetry confirmed atrial fibrillation with RVR (153 bpm), significant motion artifact. Cardiac markers WNL. BNP elevated at 2280. CXR and chest CT revealed findings consistent with chronic empysema and question of acute bronchitis. CBC did indicated a leukocytosis of 19.2K. U/a unremarkable. The patient was started on a diltiazem gtt, Lovenox for anticoagulation and treatment for A/COPD- Solumedrol IV, DuoNebs, ceftriaxone and O2.   This AM, rate has improved to 80s. He is asymptomatic today denying palpitations, lightheadedness, presyncope, chest pain or shortness of breath.   PAST MEDICAL HISTORY: Recent admission a month ago with psychosis associated with hallucinations and paranoia. Chronic obstructive pulmonary disease, home oxygen dependent. The patient is now under the care of hospice over the last 1 month. Systemic  hypertension, history of transient ischemic attack, anxiety disorder, chronic back pain, history of arthritis, history of esophageal dilatation.   PAST SURGICAL HISTORY: Back surgery x2, left foot surgery and stomach surgery.   SOCIAL HABITS: Chronic smoker, used to smoke 2 to 3 packs per day since the age of 59. He cut down to less than a pack a day, but he continues to smoke. No recent history of alcoholism, but remote history of alcoholism. He quit about 4 years ago. No other drug abuse. The patient is living on disability. He is married and living with his wife and spends most of his time watching TV.  FAMILY HISTORY: His father died from complications of congestive heart failure and stroke. His mother also suffered from stroke. Two sisters had coronary artery disease and underwent coronary artery bypass graft. One sister has a pacemaker.   Physical Exam:  GEN thin, appears older than stated age   HEENT pale conjunctivae, PERRL, hearing intact to voice   NECK supple  No masses  trachea midline  JVP 2-3 cm   RESP normal resp effort  no use of accessory muscles  wheezing  rhonchi   CARD Irregular rate and rhythm  Normal, S1, S2  No murmur  Normal rate   ABD denies tenderness  soft  normal BS   EXTR negative cyanosis/clubbing, negative edema   SKIN normal to palpation   NEURO follows commands, motor/sensory function intact   PSYCH alert, A+O to time, place, person   Review of Systems:  Subjective/Chief Complaint shortness of breath, wheezing, palpitations, cough   General: Fatigue   Respiratory: Frequent cough  Short of breath  Wheezing   Cardiovascular: Palpitations  Dyspnea  Review of Systems: All other systems were reviewed and found to be negative   Home Medications: Medication Instructions Status  haloperidol 1 mg oral tablet 1 tab(s) orally 3 times a day Active  omeprazole 20 mg oral delayed release capsule 2 cap(s) orally once a day Active  sertraline 50 mg oral  tablet 1 tab(s) orally once a day mood Active  albuterol CFC free 90 mcg/inh inhalation aerosol 2 puff(s) inhaled 4 times a day copd Active  fluticasone-salmeterol 250 mcg-50 mcg inhalation powder 1 puff(s) inhaled 2 times a day copd Active  donepezil 10 mg oral tablet 1 tab(s) orally once a day (at bedtime) dementia Active  DuoNeb 2.5 mg-0.5 mg/3 mL inhalation solution 3 milliliter(s) inhaled every 6 hours Active  metFORMIN 500 mg oral tablet 1 tab(s) orally 2 times a day Active  Aggrenox 25 mg-200 mg oral capsule, extended release 1 cap(s) orally 2 times a day Active  theophylline 200 mg oral tablet, extended release 1 tab(s) orally every 12 hours Active  Senna S 50 mg-8.6 mg oral tablet 2 tab(s) orally once a day (at bedtime) Active   EKG:  Interpretation atrial fibrillation with RVR, incomplete RBBB   Rate 153   EKG Comparision Changed from  07/2012 EKG    Lodine: Hives  Codeine: Rash  Neurontin: Hives  Ultram: Hives  Levaquin: Itching, Hives  Vital Signs/Nurse's Notes: **Vital Signs.:   03-Jul-14 07:22  Temperature Temperature (F) 97.5  Celsius 36.3  Temperature Source oral  Pulse Pulse 92  Respirations Respirations 16  Systolic BP Systolic BP 116  Diastolic BP (mmHg) Diastolic BP (mmHg) 71  Mean BP 86  Pulse Ox % Pulse Ox % 95  Pulse Ox Activity Level  At rest  Oxygen Delivery 3L; Nasal Cannula  Pulse Ox Heart Rate 62    Impression 59yo male with end-stage, O2 dependent COPD, HTN, h/o CVA/TIA, ongoing tobacco abuse, GERD, chronic pain, anxiety, 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  1. New onset a-fib with RVR Admitted earlier today for new onset a-fib with RVR. He has severe, O2 dependent COPD at baseline predisposing to particularly atrial arrhythmias. COPD exacerbation likely represents the triggering event. Interestingly, the patient has a history of what sounds like SVT  requiring adenosine in the past. A Holter monitor was to be arranged as an outpatient in 05/14 for persistent, intermittent palpitations, however the patient did not show. Unclear time frame of a-fib, but likely paroxysmal over the past year. This may also be the source of his TIA. Rate is controlled well on diltiazem gtt. Asymptomatic this AM. Maintaining NSR may prove to be difficult with advanced COPD. Aim for rate-control strategy.   -- Continue diltiazem gtt given good rate response, plan to transition to PO formulation (CCB good choice with severe COPD, if BB needed in future, would use B1 selective agent) -- CHADSVASc = at least 3 in HTN, h/o CVA/TIA. Will need thromboembolic protection. Favor NOAC over Coumadin for convenience given that patient is currently on home health hospice and question of compliance. Xarelto with its once daily dosing may be one option.  -- Consider checking repeat echocardiogram as BNP elevated this admission. Euvolemic on exam.  -- Treat A/COPD -- Will d/c the Aggrenox for now given increased bleeding risk. Thought process is that prior CVA/TIA was from thromboembolic source from undiagnosed a-fib and Xarelto will now protect against that. If primary ischemic  CVA was the cause, he may need to be on at least a low-dose ASA. Will need neuro follow-up.   Plan 2. A/COPD On DuoNebs, ceftriaxone and O2 per primary team. Received a small dose of Solumedrol. Consider switching albuterol to levalbuterol to avoid potentiating tachycardia. Agree with avoiding sympathomimetics. Continued tobacco abuse certainly is not helping.  3. Hypertension Well-controlled.   Electronic Signatures for Addendum Section:  Lorine Bears (MD) (Signed Addendum 03-Jul-14 17:45)  The patient was seen and examined. Agree with above. Recommend rate control with PO Diltiazem. Discussed risks and benefits of anticoagulation. Will start Xarelto and stop Aggrenox.   Electronic Signatures: Gery Pray (PA-C)  (Signed 03-Jul-14 10:05)  Authored: General Aspect/Present Illness, History and Physical Exam, Review of System, Home Medications, EKG , Allergies, Vital Signs/Nurse's Notes, Impression/Plan Lorine Bears (MD)  (Signed 03-Jul-14 17:45)  Co-Signer: General Aspect/Present Illness, Home Medications, EKG , Allergies, Vital Signs/Nurse's Notes, Impression/Plan   Last Updated: 03-Jul-14 17:45 by Lorine Bears (MD)

## 2014-06-18 NOTE — Discharge Summary (Signed)
PATIENT NAME:  Brandon Moreno, Brandon Moreno MR#:  [id number] DATE OF BIRTH:  05-02-1955  DATE OF ADMISSION:  07/09/2012 DATE OF DISCHARGE:  07/14/2012  HOSPITAL COURSE: See dictated history and physical for details of admission. This 59 year old man has a history of psychotic symptoms that may be related to a history of strokes; however, it seems to inevitably come back when he is off of Haldol. He had been off of the Haldol apparently because his primary care doctor would no longer prescribe it to him. He developed paranoia and hallucinations at home that necessitated his coming to the hospital. In the hospital, he was perfectly compliant with re-starting low-dose haloperidol and also his Aricept. He did not show any dangerous or aggressive behavior on the unit. He was cooperative with treatment. The patient was educated on the importance of continuing to stay on his medication even if it meant having to go to a mental health agency to get it, and he agreed to the plan. No other new issues arose. He was discharged home with plans for followup in the community either through his primary care doctor or through [facility].   DISCHARGE MEDICATIONS: Aricept 10 mg at bedtime, haloperidol 1 mg b.i.Moreno., Prilosec 20 mg b.i.Moreno., Zoloft 50 mg per day, aspirin/diperidamol 25/200 mg (Aggrenox) 1 capsule b.i.Moreno., Advair Diskus 250/50 inhaler 1 puff b.i.Moreno., Spiriva Handihaler 1 capsule per day and albuterol 2 puffs p.r.n. q. 4 hours for shortness of breath.   LABORATORY RESULTS: Urinalysis unremarkable. Drug screen negative. CBC normal except for slightly elevated white count at 12.1. Alcohol undetected. Chemistry shows a low creatinine at 0.59, chloride elevated at 108.   MENTAL STATUS EXAM AT DISCHARGE: Elderly gentleman, looks older than his stated age, cooperative with the interview. Good eye contact, normal psychomotor activity. Speech decreased in total amount. Affect somewhat flattened. Mood stated as okay. Thoughts are slow and  somewhat demented. Affect blunted. Denies hallucinations. Denies auditory or visual hallucinations. Denies delusions. Denies suicidal or homicidal ideation. Shows adequate judgment and insight. Impaired intelligence from history of strokes.   DISPOSITION: Discharge home with his wife. Follow up with [facility].   DIAGNOSIS, PRINCIPAL AND PRIMARY:   AXIS I:  1.  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  2.  No further. AXIS II: Deferred.  AXIS III: History of strokes, history of chronic obstructive pulmonary disease and gastric reflux symptoms.  AXIS IV: Moderate from burden of illness.  AXIS V: Functioning at time of discharge 55.   ____________________________ Audery AmelJohn T. Ziad Maye, MD jtc:aw Moreno: 07/15/2012 00:26:40 ET T: 07/15/2012 07:09:44 ET JOB#: [id number]  cc: Audery AmelJohn T. Hanne Kegg, MD, <Dictator> Audery AmelJOHN T Gradie Ohm MD ELECTRONICALLY SIGNED 07/15/2012 9:37

## 2014-06-18 NOTE — H&P (Signed)
PATIENT NAME:  Brandon Moreno, Brandon Moreno MR#:  [id number] DATE OF BIRTH:  23-Aug-1955  DATE OF ADMISSION:  08/28/2012  PRIMARY CARE PHYSICIAN: Dr. Hyman Hopes   REFERRING PHYSICIAN: Dr. Bayard Males   CHIEF COMPLAINT: Increased shortness of breath, wheezing and palpitations.   HISTORY OF PRESENT ILLNESS: Brandon Moreno is a 59 year old Caucasian male with a history of end-stage chronic obstructive pulmonary disease, home oxygen dependent on 2 liters, recently placed on hospice care due to advanced lung disease and he continues to smoke. Last admission to this hospital a month ago with psychosis, seen by the psychiatrist, and he maintained him on Haldol 1 mg 3 times a day. This time, the patient presented with increased shortness of breath and wheezing, getting worse over the last 2 days. Symptoms intensified over the last 24 hours associated with palpitations. He has some cough, either at his baseline or slightly more possibly. It is difficult to get much information from him and most of the historical information from his wife. The patient is less interactive and sluggish to give any meaningful information. Evaluation here is consistent with acute exacerbation of chronic obstructive pulmonary disease. Some peribronchial thickening and the bronchial consolidation on the right lower lobe by CAT scan may indicate severe bronchitis versus early pneumonia. His EKG showed atrial fibrillation, which is a new finding, with rapid ventricular rate. The patient was treated with intravenous diltiazem drip started at the Emergency Department and admitted to the hospital for further evaluation and treatment.   REVIEW OF SYSTEMS:  CONSTITUTIONAL: Unsure if he has fever or not, but noticed to have some sweating and appears to be feverish to the wife. No chills. He has mild fatigue, but this is not unusual for him.  EYES: No blurring of vision. No double vision.  ENT: No hearing impairment. No sore throat. No dysphagia.   CARDIOVASCULAR: No chest pain, but reported the palpitations. No syncope.  RESPIRATORY: Reports shortness of breath and wheezing and some cough. No hemoptysis.  GASTROINTESTINAL: No abdominal pain, no vomiting, no diarrhea.  GENITOURINARY: No dysuria. No frequency of urination.  MUSCULOSKELETAL: No joint pain or swelling. No muscular pain or swelling.  INTEGUMENTARY: No skin rash. No ulcers.  NEUROLOGY: No focal weakness. No seizure activity. No headache.  PSYCHIATRY: No anxiety. I cannot determine if he has depression on top of his symptoms or not, but recently he had psychosis and treated with Haldol. At that time he presented with paranoia and hallucinations; that is a month ago.  ENDOCRINE: No polyuria or polydipsia. No heat or cold intolerance.   PAST MEDICAL HISTORY: Recent admission a month ago with psychosis associated with hallucinations and paranoia. Chronic obstructive pulmonary disease, home oxygen dependent. The patient is now under the care of hospice over the last 1 month. Systemic hypertension, history of transient ischemic attack, anxiety disorder, chronic back pain, history of arthritis, history of esophageal dilatation.   PAST SURGICAL HISTORY: Back surgery x2, left foot surgery and stomach surgery.   SOCIAL HABITS: Chronic smoker, used to smoke 2 to 3 packs per day since the age of 70. He cut down to less than a pack a day, but he continues to smoke. No recent history of alcoholism, but remote history of alcoholism. He quit about 4 years ago. No other drug abuse.   FAMILY HISTORY: His father died from complications of congestive heart failure and stroke. His mother also suffered from stroke. Two sisters had coronary artery disease and underwent coronary artery bypass graft. One  sister has a pacemaker.   SOCIAL HISTORY: The patient is living on disability. He is married and living with his wife and spends most of his time watching TV.   ADMISSION MEDICATIONS: Theophylline 200  mg twice a day, sertraline 50 mg once a day, senna 2 tablets once a day, omeprazole 20 mg once a day, metformin 500 mg twice a day, Haldol 1 mg 3 times a day, fluticasone and salmeterol 1 puff twice a day, DuoNebs every 6 hours, donepezil 10 mg a day, albuterol inhalation 4 times a day p.r.n., Aggrenox 25/200 one capsule twice a day.   ALLERGIES: REPORTED TO LEVAQUIN, LODINE,  CODEINE, NEURONTIN AND ULTRAM.   PHYSICAL EXAMINATION: VITAL SIGNS: His blood pressure of 105/74, respiratory rate 22, pulse 85, temperature 99.3, oxygen saturation 94% while on oxygen.  GENERAL APPEARANCE: Middle-aged male who looks older than his stated age.  HEAD AND NECK: No pallor. No icterus. No cyanosis. Ear examination revealed normal hearing, no discharge, no lesions. Examination of the nose showed no discharge, no bleeding, no ulcers. Oropharyngeal examination revealed normal lips and tongue. Dry mucous membranes. He is edentulous, not wearing his dentures. Eye examination revealed normal eyelids and conjunctivae. Pupils are about 4 mm, round, equal and sluggishly reactive to light. Neck is supple. Trachea at midline. No thyromegaly. No cervical lymphadenopathy. No masses.  HEART: Regular S1, S2. No S3, S4. No murmur. No gallop. No carotid bruits.  RESPIRATORY: Revealed breathing pattern is slightly labored and without use of accessory muscles. No rales, but he has bilateral expiratory wheezing and prolonged expiratory phase and diffuse rhonchi.  ABDOMEN: Soft without tenderness. No hepatosplenomegaly. No masses. No hernias.  SKIN: Revealed no ulcers. No subcutaneous nodules.  MUSCULOSKELETAL: No joint swelling, but he has clubbing of fingers noted bilaterally.  NEUROLOGIC: Cranial nerves II through XII are intact. No focal motor deficit.  PSYCHIATRIC: The patient is alert, oriented to the place and people. He is lethargic and sleepy. Affect is flat. Mood is normal.   LABORATORY FINDINGS: His EKG showed atrial  fibrillation with rapid ventricular rate at rate of 153 per minute. Nonspecific ST-T wave abnormalities. Chest x-ray showed chronic lung changes and increased pulmonary markings. CT scan of the chest showed chronic emphysematous lung changes. No evidence of pulmonary embolism. There is peribronchial thickening and some bronchial opacification, especially in the lower lobe. This is either infectious or inflammatory process. CBC showed elevated white blood cell count 19,500, hemoglobin 15, hematocrit 46, platelet count 211. Prothrombin time 13. INR 1. Urinalysis was unremarkable. Serum glucose 135, BUN 10, creatinine 0.6, sodium 143, potassium 4. Albumin 3.1; otherwise, normal liver function tests and liver transaminases. Troponin 0.02.   ASSESSMENT: 1. Acute exacerbation of chronic obstructive pulmonary disease.  2. Some pulmonary infiltrates, either secondary to severe bronchitis versus early pneumonia.  3. Atrial fibrillation with rapid ventricular rate. This is a new finding.  4. Systemic hypertension.  5. Chronic smoking.  6. Recent admission with psychosis, maintained on Haldol, and there is also possibility of dementia.  7. His other medical problems include anxiety and chronic back pain and arthritis and ongoing tobacco abuse. Also a history of transient ischemic attack.   PLAN: We will admit the patient to the Intensive Care Unit. I will continue IV diltiazem drip that was started at the Emergency Department. I will add Lovenox 1 mg/kg subcutaneously twice a day for anticoagulation to reduce thromboembolic phenomenon. IV Rocephin 1 gram daily to combat the pulmonary findings. DuoNebs q.6 hours p.r.n. and  small dose of IV Solu-Medrol. I will discontinue Solu-Medrol and theophylline and the additional albuterol inhaler to minimize tachycardia. I did not order echocardiogram since his last echo was a year ago in May 2013 showing ejection fraction more than 55%, trace mitral regurgitation, otherwise  unremarkable echo. I will obtain cardiology consult for further evaluation. Continue the rest of his home medications. His code status is FULL CODE. I will continue oxygen supplementation on 2 liters. The patient was advised to quit smoking several times, but he does not show determination to quit.   Time spent in evaluating this patient took more than 55 minutes including reviewing his medical records and also discussion with his wife.    ____________________________ Carney Corners. Rudene Re, MD amd:aw D: 08/28/2012 05:07:18 ET T: 08/28/2012 05:34:06 ET JOB#: [id number]  cc: Carney Corners. Rudene Re, MD, <Dictator> Zollie Scale MD ELECTRONICALLY SIGNED 08/29/2012 6:25

## 2014-06-18 NOTE — Discharge Summary (Signed)
PATIENT NAME:  Brandon Moreno, Brandon Moreno MR#:  [id number] DATE OF BIRTH:  Aug 23, 1955  DATE OF ADMISSION:  08/28/2012 DATE OF DISCHARGE:  09/01/2012  REASON FOR ADMISSION:  Increased shortness of breath, wheezing and palpitations.   PRIMARY CARE PHYSICIAN:  Dr. Fulton MoleHarriett Burns.   CONSULTANT:  Dr. Julien Nordmannimothy Gollan, for cardiology.   DISCHARGE DIAGNOSES: 1.  Atrial fibrillation/atrial flutter with rapid ventricular response.  2.  Chronic obstructive pulmonary disease with exacerbation.  3.  Chronic respiratory failure with acute exacerbation due to pneumonia.  4.  Right lower lobe community-acquired pneumonia.  5.  Hypertension.  6.  History of previous transient ischemic attacks.  7.  Chronic anticoagulation with Xarelto.  8.  Anxiety disorder.  9.  History of previous psychosis.  10.  Chronic back pain.  11.  Possible abuse of narcotics.  12.  Increased white blood cells.  13.  Systemic inflammatory response syndrome evidenced by tachycardia, tachypnea and increased white blood cells, likely secondary to pneumonia.   DISPOSITION:  Home with hospice as the patient has been followed by them already.   MEDICATIONS AT DISCHARGE:  1.  Sertraline 50 mg once a day.  2.  Albuterol HFA 90 mcg 2 puffs 4 times a day.  3.  Fluticasone salmeterol 250/50 mcg twice daily.  4.  Donepezil 10 mg once a day.  5.  DuoNebs every 6 hours.  6.  Metformin 500 mg twice daily.  7.  Theophylline 200 mg every 12 hours.  8.  Haloperidol 1 mg 3 times a day.  9.  Senna 50 mg at bedtime.  10.  Omeprazole 20 mg 2 tablets once a day.  11.  Prednisone taper starting with 40 mg.  12.  Oxycodone 10 mg every 4 to 6 hours as needed for pain.  13.  Xarelto 20 mg once a day.  14.  Spiriva 18 mcg once daily.  15.  Cardizem 120 mg 1 capsule once a day extended-release.  16.  Augmentin XR 1000 mg 2 tabs orally twice daily.  17.  Digoxin 125 mg once daily.   FOLLOW-UP:  With hospice and with primary care physician, Dr. Fulton MoleHarriett  Burns and with Julien Nordmannimothy Gollan in 1 to 2 weeks.    IMPORTANT RESULTS:  BNP 2200, glucose 135, creatinine 1.64, albumin 2.1.  Troponins were negative.  White count is 19,000, hemoglobin is 15.8.  White count at discharge 9.2.  CT of the chest to rule out pulmonary embolism showed chronic emphysematous lung disease with evidence of atelectasis bilaterally, atherosclerosis calcification.  No pulmonary embolism, chronic emphysematous changes with some bullous changes as described, peribronchial thickening.   HOSPITAL COURSE:  Brandon Moreno is a very nice 59 year old gentleman with history of severe COPD in the past admitted for psychosis.  He has oxygen dependency and he uses 2 liters of oxygen at home.  He has been followed by hospice due to his advanced lung disease.  The patient comes with a history of increased shortness of breath, wheezing getting worse for the past two days as symptoms have been gradually increasing and is starting to have some palpitations.  The patient is seen in the ER.  EKG shows atrial fibrillation with RVR for what the patient is admitted, put on the critical care unit with a drip of Cardizem and his heart rate improved.  As far medical problems:  1.  Atrial fibrillation with RVR.  The patient was evaluated by cardiology.  The patient already has polymorphic atrial tachycardia, now went into  atrial fibrillation.  His medications were adjusted.  He is likely going to have this happening again since he has such a severe lung disease.  He is on diltiazem now and his Lovenox.  His digoxin was started here in the hospital.  The patient was going to be discharged on the 6th of July, but as soon as he was about to leave the door after hospice was arranged, the patient started having significant tachycardia for what he received double dose of the Cardizem.  After Cardizem was given during the middle of the night the patient had a couple of episodes of bradycardia on the 30s.  I had a long  discussion with Dr. Kirke Corin about this.  He states it is better just to leaving 120 of Cardizem and 125 of diltiazem and monitor closely in the office.  He is going to be in and out atrial fibrillation RVR due to his bad lungs.  The patient is also taking Xarelto because he is at high risk of a stroke with a [place] score and [place] score being high.  The patient has to follow with Dr. Mariah Milling and Dr. Kirke Corin.  As far as his other medical problems:  2.  Acute on chronic respiratory failure.  The patient received nebs, steroids, oxygen up to 5 liters and then we were able to wean him off to 2 liters.  Guaifenesin given.   3.  Right lower lobe pneumonia.  The patient has been put on azithromycin and Rocephin.  Chest x-ray showed an infiltrate on the left lower lobe.  4.  Hypertension.  He did not have any major issues with the high blood pressure.  5.  History of transient ischemic attack.  The patient needs to have Aggrenox, but now decision was made to change it to Xarelto to prevent CVA.  6.  Anxiety. 7.  Chronic back pain.  The patient on Roxicodone.   The patient was discharged on hospice.    I spent about 45 minutes with this patient.    ____________________________ Felipa Furnace, MD rsg:ea Moreno: 09/06/2012 16:27:45 ET T: 09/07/2012 00:00:15 ET JOB#: [id number]  cc: Felipa Furnace, MD, <Dictator> Harriett Pietro Cassis, MD Antonieta Iba, MD Pearletha Furl MD ELECTRONICALLY SIGNED 09/07/2012 13:33

## 2014-06-20 NOTE — Discharge Summary (Signed)
PATIENT NAME:  Brandon Moreno, [name] MR#:  [id number] DATE OF BIRTH:  12-28-1955  DATE OF ADMISSION:  07/19/2011 DATE OF DISCHARGE:  07/21/2011  NOTE: The patient left AGAINST MEDICAL ADVICE.    ADMITTING DIAGNOSES:  1. "Not feeling well."  2. Left-sided numbness and weakness.   DIAGNOSES ON 07/21/2011 PRIOR TO THE PATIENT'S DISCHARGE:  1. Possible transient ischemic attack.  2. Chronic obstructive pulmonary disease.  3. Anxiety.  4. Hypertension.  5. 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   CONSULTANTS:  1. Dr. Kirke CorinArida.  2. Dr. Suzan SlickPeter Clarke.   LABORATORY, DIAGNOSTIC AND RADIOLOGICAL DATA:  MRI of the brain showed findings concerning for chronic sinus disease. White matter signal abnormalities which were nonspecific.  Carotid Doppler showed LVEF normal, ejection fraction greater than 55%. Carotid Doppler showed no significant carotid stenosis with atherosclerotic disease without evidence of hemodynamically significant stenosis.  Please refer to the rest of the other blood work for details.   HOSPITAL COURSE: In brief the patient is a 59 year old white male who was under a lot of stress recently at home due to his sister being in the Intensive Care Unit here. He presented with left-sided weakness. Further work-up, including Neurology evaluation and MRI of the brain were done. MRI of the brain showed no stroke. There were white matter changes, very nonspecific. He had a normal echocardiogram. Carotid arteries showed no significant carotid stenosis. He was seen by Dr. Kemper Durielarke, who felt that it could be a transient ischemic attack. Also, the patient exhibited some anxiety-related symptoms. Therefore, a Psychiatry consult was requested. Prior to the psychiatrist coming to see the patient, the patient did not want to stay in the hospital and he signed AGAINST MEDICAL ADVICE. The patient was not suicidal or homicidal. The patient also during the hospitalization had a bradycardia with an episode of tachycardia. He  was seen by Cardiology. They stated that there was no need for pacer for now, but he will need outpatient followup with a Holter monitor.   Please note, before all these things could be arranged, the patient stated that he did not want to stay in the hospital and signed AGAINST MEDICAL ADVICE in the evening of 07/21/2011.    ____________________________ Lacie ScottsShreyang H. Allena KatzPatel, MD shp:cbb D: 07/24/2011 08:42:30 ET T: 07/24/2011 11:56:01 ET JOB#: [id number]  cc: Ednah Hammock H. Allena KatzPatel, MD, <Dictator> Charise CarwinSHREYANG H Nyala Kirchner MD ELECTRONICALLY SIGNED 07/26/2011 13:36

## 2014-06-20 NOTE — Consult Note (Signed)
PATIENT NAME:  [name], [name] MR#:  [id number] DATE OF BIRTH:  Jan 28, 1956  DATE OF CONSULTATION:  07/20/2011  REFERRING PHYSICIAN:  Dr. Sherryll Burger CONSULTING PHYSICIAN:  Rose Phi. Kemper Durie, MD  HISTORY: Mr. Brandon Moreno is a 59 year old right-handed married white patient of Dr. [name] with a history of chronic tobacco use, chronic obstructive pulmonary disease, remote ethanol abuse, hypertension, bradycardia, paroxysmal supraventricular tachycardia, arthritis, question of rheumatoid arthritis, and history of anxiety and was admitted 07/19/2011 and is referred for evaluation of altered mental status, left side weakness, and abnormal brain MRI scan. History comes from the patient who is accompanied by his wife of 16 years, his hospital chart, and hospital records.   The patient was brought to the Emergency Room 07/17/2011 by EMTs summoned by his wife secondary to a three-day history of dizziness and generalized weakness. The patient was found by EMTs to be tachycardic with supraventricular tachycardia and heart rate 200. He was treated successfully with IV Adenocard.   The patient was brought to the Emergency Room at 4:20 p.m. on 07/19/2011 by his wife with concern regarding possible stroke in the setting of new left side symptoms and changes in mental status and behavior. The patient reports awakening that morning with numbness and decreased feeling and tingling of the left face and body, some weakness in the left arm and leg, slurred speech, slight discomfort, primarily involving the left flank area, lateral thorax. At approximately noon, his wife relates the onset of cycles of change of behavior, periods when for several minutes or perhaps half an hour, he would repeat the number 6 and talk about persecution or paranoid ideas. She states that he said "This is a set up for me", and "I know something is wrong", and pointing to an area of the scalp behind the right ear-talking of a chip having been implanted in  his brain to control him.   Brain MRI scan shows no acute findings on diffusion sequences. His scan shows very small areas of altered signal intensity bilaterally of hemispheric white matter regions, including periventricular areas on FLAIR and T2 sequences. Doppler study of the carotid shows no evidence of surgical disease; there was normal antegrade flow of vertebral arteries. On telemetry he has had normal sinus rhythm and mild bradycardia.   PAST MEDICAL HISTORY: This is primarily notable for the items listed in the first line above. He has had chronic back pain. He has had esophagus dilation. He reports diagnosis of suspected rheumatoid arthritis by Dedra Skeens of [facility]. He and his wife report six or seven episodes in the past four or five years when he has had very fast heart rate for which he has felt lightheaded. They generally last 10 to 15 minutes and will ease on sitting to rest, the episode on the 21st of May did not clear so that EMTs were summoned.   They report that he had nervous breakdown many years ago for which he was seen in [facility] and then was admitted to [facility]. This was some years before their marriage. He had been treated with Haldol and Xanax by his primary care physician in the past, Dr. Mila Merry, but he has not been on medication for 2 to 3 years. His wife reports that he has not done as well, has been anxious and having worsening problems with sleep, within the past year and that for the past two weeks he has been more anxious yet and had had more problems  with primary insomnia. He has a long history of primarily secondary insomnia. Surgeries include esophageal dilation, a surgery for his stomach, two back surgeries, and three surgeries for left foot and toes, including amputation of toes.   HABITS: He is a chronic 1-1/2 pack per day smoker, but has cut back to 1/2 pack a day in the last year or so. Approximately two  years ago he cut back alcohol use from 6 to 12 beers a day to some beers on weekends only.   ALLERGIES: His list includes codeine, Levaquin, Lodine, Neurontin, and Ultram.   MEDICATIONS: His medications include one small aspirin a day, Prilosec 20 mg a day for acid reflux, albuterol inhaler and nebulizer, Advair Diskus 250/50 one inhalation twice a day, and Spiriva, one inhalation a day.   FAMILY AND SOCIAL HISTORY: He lives with his wife of 16 years. His father had heart failure and stroke. His mother died of a stroke. His sister has recently been in the hospital with myocardial infarction, and his sister has a pacemaker. Another sister has history of coronary artery disease and has had CABG surgery. He has a brother who has had CABG surgery.   PHYSICAL EXAMINATION: The patient is a slender white gentleman who has had  loss of toes of left foot, pleasant and cooperative, in no apparent distress, examined lying initially semisupine. Blood pressure was 155/75 with heart rate 52. There was no fever. He was normocephalic without evidence of trauma; his neck was supple with mild decrease of cervical range of motion.   He was alert and fully oriented with clear speech and normal expression. He was lucid and was a good historian with normal affect. Cranial nerve examination was normal including symmetric facial appearance at rest and with conversation, normal eye movements, and full visual field to finger count for each eye; visual acuity was not tested. On motor examination of the extremities, there was normal tone throughout. Strength was intact, rated 5 out of 5 proximally and distally in the right arm and leg. Strength was rated greater than or equal to 5- out of 5 on the left in the arm and leg with some early give way with strength testing, coordination testing was symmetric and was normal. There was no tremor or other abnormal spontaneous movement. Extremity sensation was symmetric and notable for  decreased vibration sensation of the great toes for age. Reflexes were symmetrically 1+ in the upper extremities, 1 to 2 at the knees and trace at the ankles. He stood without difficulty and had symmetric stance. There was no Romberg sign. Gait testing was not performed.   IMPRESSION:  1. 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  2. History of paroxysmal supraventricular tachycardia, precipitating an ER visit 07/17/2011.  3. On examination he seemed to have decreased vibration over the toes and decrease of ankle reflexes, most likely reflecting an element of sensory  peripheral neuropathy with history of chronic ethanol exposure in the past.  4. Symptoms beginning the morning of 07/19/2011 suggestive of possible transient ischemic attack with slurred speech, mild weakness of the left arm and leg, and described decreased feeling and tingling numbness.  5. Abnormal brain MRI scan, notable for small areas of altered signal intensity and hemispheric white matter, including periventricular regions. Differential includes small vessel disease in patient with risk factors for atherosclerotic cerebrovascular disease. The pattern in his MRI scan raises lower  suspicion of demyelinating disease or vasculitic process.   RECOMMENDATIONS:  1. I agree with having him continue on one small aspirin as an antiplatelet and agree with his imaging and laboratory studies in hospital.  2. Psychiatric referral.   I appreciate being asked to see this pleasant and interesting gentleman.  ____________________________ Rose PhiPeter R. Kemper Durielarke, MD prc:cbb D: 07/21/2011 09:53:42 ET T: 07/21/2011 10:41:06 ET JOB#: [id number] cc: Rose PhiPeter R. Kemper Durielarke, MD, <Dictator> Gaspar GarbePETER R Shada Nienaber MD ELECTRONICALLY SIGNED 07/24/2011 13:03

## 2014-06-20 NOTE — Consult Note (Signed)
General Aspect 59 year old Caucasian male with history of chronic obstructive pulmonary disease, CAD, contiuned smoking, who was in his usual state of health until the last three days when he started to have a gradual increase in shortness of breath and wheezing. Recent admission for tachycardia and badycardia ast month. Cardiology was consulted for bradycardia.  He reports significant SOB on arrival. He called EMTs and received several treatments of bronchodilator and then more treatment in the Emergency Department with mild to moderate improvement. He denies any significant sputum producation. No fever No edema, PND or orthopnea.    Present Illness . FAMILY HISTORY: His father died from complications of congestive heart failure and stroke. His mother suffered from stroke also. He has two sisters who have coronary artery disease and underwent coronary artery bypass graft. He has one sister who has a pacemaker.   SOCIAL HABITS: Chronic smoker. He continues to smoke. He used to smoke 2 to 3 packs a day since age of 89. He cut down to about 4 to 5 cigarettes a day. He has remote history of alcoholism. He quit about four years ago. No other drug abuse.   Physical Exam:   GEN well developed, well nourished, no acute distress    HEENT red conjunctivae    NECK supple  No masses    RESP normal resp effort  wheezing  diffusely decreased BS    CARD Regular rate and rhythm  No murmur    ABD denies tenderness  soft    LYMPH negative neck    EXTR negative edema    SKIN normal to palpation    NEURO cranial nerves intact, motor/sensory function intact    PSYCH alert, A+O to time, place, person, good insight   Review of Systems:   Subjective/Chief Complaint SOB    General: Fatigue    Skin: No Complaints    ENT: No Complaints    Eyes: No Complaints    Neck: No Complaints    Respiratory: Short of breath  Wheezing    Cardiovascular: No Complaints    Gastrointestinal: No Complaints     Genitourinary: No Complaints    Vascular: No Complaints    Musculoskeletal: No Complaints    Neurologic: No Complaints    Hematologic: No Complaints    Endocrine: No Complaints    Psychiatric: No Complaints    Review of Systems: All other systems were reviewed and found to be negative    Medications/Allergies Reviewed Medications/Allergies reviewed     brain tumor-inoperable:    emphysema:    asthma:    cirrhosis:    lung cancer:    Anxiety:    COPD:    FOOT SURGERY:    Back Surgery:        Admit Diagnosis:   COPD EXACERBATION: 28-Aug-2011, Active, COPD EXACERBATION      Admit Reason:   COPD exacerbation: (491.21) 24-Aug-2011, Active, ICD9, Obstructive chronic bronchitis with exacerbation      DME/HH/HomeO2:   Home Oxygen: (3) 09-Jul-2006, Active, [facility], Home Oxygen, [facility]  Home Medications: Medication Instructions Status  Aricept 5 mg oral tablet 1 tab(s) orally once a day (at bedtime) Active  Haldol 1 milligram(s) orally once a day (in the morning) Active  Advair Diskus 250 mcg-50 mcg inhalation powder 1 puff(s) inhaled 1-2 times a day Active  Ventolin HFA 90 mcg/inh inhalation aerosol 1 puff(s) inhaled , As Needed Active  albuterol 2.5 mg/3 mL (0.083%) inhalation solution 3 milliliter(s) inhaled 2-3 times a day, As  Needed- for Shortness of Breath  Active  omeprazole 40 mg oral delayed release capsule 1 cap(s) orally once a day Active  Ambien 10 mg oral tablet 1 tab(s) orally once a day (at bedtime) Active  Aggrenox 25 mg-200 mg oral capsule, extended release 1 cap(s) orally 2 times a day Active  Spiriva 18 mcg inhalation capsule 1 each inhaled once a day Active   Lab Results:  Routine Chem:  28-Jun-13 02:08    Glucose, Serum 90   BUN 12   Creatinine (comp) 0.72   Sodium, Serum 143   Potassium, Serum 4.0   Chloride, Serum  108   CO2, Serum 29   Calcium (Total), Serum 8.9   Anion Gap  6   Osmolality (calc) 284   eGFR (African  American) >60   eGFR (Non-African American) >60 (eGFR values <69m/min/1.73 m2 may be an indication of chronic kidney disease (CKD). Calculated eGFR is useful in patients with stable renal function. The eGFR calculation will not be reliable in acutely ill patients when serum creatinine is changing rapidly. It is not useful in  patients on dialysis. The eGFR calculation may not be applicable to patients at the low and high extremes of body sizes, pregnant women, and vegetarians.)   Result Comment POTASSIUM/CK - Slight hemolysis, interpret results with  - caution.  Result(s) reported on 24 Aug 2011 at 02:42AM.  Cardiac:  28-Jun-13 02:08    Troponin I < 0.02 (0.00-0.05 0.05 ng/mL or less: NEGATIVE  Repeat testing in 3-6 hrs  if clinically indicated. >0.05 ng/mL: POTENTIAL  MYOCARDIAL INJURY. Repeat  testing in 3-6 hrs if  clinically indicated. NOTE: An increase or decrease  of 30% or more on serial  testing suggests a  clinically important change)   CK, Total  393   CPK-MB, Serum  4.6 (Result(s) reported on 24 Aug 2011 at 02:45AM.)  Routine Hem:  28-Jun-13 02:08    WBC (CBC)  12.3   RBC (CBC) 4.87   Hemoglobin (CBC) 14.9   Hematocrit (CBC) 44.2   Platelet Count (CBC) 234 (Result(s) reported on 24 Aug 2011 at 02:28AM.)   MCV 91   MCH 30.6   MCHC 33.7   RDW  15.2   EKG:   Interpretation EKG showing NSR with no significant ST or T wave changes    Lodine: Hives  Codeine: Rash  Neurontin: Hives  Ultram: Hives  Levaquin: Itching, Hives  Vital Signs/Nurse's Notes: **Vital Signs.:   02-Jul-13 05:19   Vital Signs Type Routine   Temperature Temperature (F) 98.2   Celsius 36.7   Temperature Source Oral   Pulse Pulse 51   Respirations Respirations 18   Systolic BP Systolic BP 1161  Diastolic BP (mmHg) Diastolic BP (mmHg) 75   Mean BP 95   Pulse Ox % Pulse Ox % 92   Pulse Ox Activity Level  At rest   Oxygen Delivery 2L     Impression 59year old Caucasian male with  history of chronic obstructive pulmonary disease, CAD, contiuned smoking, who was in his usual state of health until the last three days when he started to have a gradual increase in shortness of breath and wheezing. Recent admission for tachycardia and badycardia ast month.  1) Tachy-brady syndrome Last tachy episode was weeks ago per the patient No symptoms from bradycardia --Would continue to hold all rate medications (b-blockers, etc as you are doing) --Folow up in clinic --Have suggested to him to obtain EKG (EMT or our office) when  he has tachycardia for ablation (SVT?)  2) COPD: seems wheezy.  May need oxygen at home, Prednisone, nebs, ec  3) CAD: recommended smoking cessation Outpt follow up statin, would add asa 81 daily with aggrenox  4) Hyperlipidemia Statin   Electronic Signatures: Ida Rogue (MD)  (Signed 02-Jul-13 12:28)  Authored: General Aspect/Present Illness, History and Physical Exam, Review of System, Past Medical History, Health Issues, Home Medications, Labs, EKG , Allergies, Vital Signs/Nurse's Notes, Impression/Plan   Last Updated: 02-Jul-13 12:28 by Ida Rogue (MD)

## 2014-06-20 NOTE — H&P (Signed)
PATIENT NAME:  Brandon Moreno, Brandon Moreno MR#:  [id number] DATE OF BIRTH:  25-Oct-1955  DATE OF ADMISSION:  08/24/2011  PRIMARY CARE PHYSICIAN: Dr. [name]   CHIEF COMPLAINT: Increased shortness of breath x3 days.   HISTORY OF PRESENT ILLNESS: Mr. Grizzell is a 59 year old Caucasian male with history of chronic obstructive pulmonary disease who was in his usual state of health until the last three days when he started to have a gradual increase in shortness of breath and wheezing that got worse over time. In the last 24 hours he got very tight in his chest and wheezing a lot. He called the ambulance and received several treatments of bronchodilator and then more treatment in the Emergency Department with mild to moderate improvement. The patient was admitted for further evaluation and treatment. He reports no fever. No sputum production.   REVIEW OF SYSTEMS: CONSTITUTIONAL: Denies any fever. No chills. No night sweats. No fatigue. EYES: No blurring of vision. No double vision. ENT: No hearing impairment. No sore throat. No dysphagia. CARDIOVASCULAR: No chest pain other than soreness on the left side of the chest since last admission. The patient reports shortness of breath and wheezing as above. No edema. No syncope. RESPIRATORY: Reports shortness of breath. No worsening cough or sputum production. No hemoptysis. GASTROINTESTINAL: No abdominal pain. No vomiting. No diarrhea. GENITOURINARY: No dysuria. No frequency of urination. MUSCULOSKELETAL: No joint pain or swelling. No muscular pain or swelling. INTEGUMENTARY: No skin rash. No ulcers. NEUROLOGY: No focal weakness. No seizure activity. No headache. PSYCHIATRY: No anxiety. No depression. ENDOCRINE: No polyuria or polydipsia. No heat or cold intolerance.   PAST MEDICAL HISTORY:  1. Chronic obstructive pulmonary disease. 2. Hypertension. 3. 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  4. Anxiety. 5. Chronic back pain. 6. History of  esophageal dilatation.  7. History of arthritis thought to be rheumatoid arthritis.    PAST SURGICAL HISTORY:  1. Two back surgeries. 2. Stomach surgery. 3. Left foot surgery.   FAMILY HISTORY: His father died from complications of congestive heart failure and stroke. His mother suffered from stroke also. He has two sisters who have coronary artery disease and underwent coronary artery bypass graft. He has one sister who has a pacemaker.   SOCIAL HABITS: Chronic smoker. He continues to smoke. He used to smoke 2 to 3 packs a day since age of 30. He cut down to about 4 to 5 cigarettes a day. He has remote history of alcoholism. He quit about four years ago. No other drug abuse.   SOCIAL HISTORY: He is married, living with his wife. He is living on disability.   ADMISSION MEDICATIONS:  1. Ventolin inhaler p.r.n. 2. Advair 250/50 twice a day.  3. Aggrenox 1 capsule twice a day. 4. Omeprazole 40 mg once a day.  5. Ambien 10 mg a day.   ALLERGIES: Levaquin, Lodine, codeine, Neurontin, and Ultram.   PHYSICAL EXAMINATION:  VITAL SIGNS: Blood pressure 153/77, respiratory rate 20, pulse 78, temperature 97.8, oxygen saturation 97%.   GENERAL APPEARANCE: Middle-aged male laying in bed in no acute distress, thin looking.   HEAD: No pallor. No icterus. No cyanosis.   EARS, NOSE, AND THROAT: Hearing was normal. Nasal mucosa, lips, tongue were normal. The patient is edentulous and he is wearing no dentures.   EYES: Normal iris and conjunctivae. Pupils about 5 mm, equal and reactive to light.   NECK: Supple. Trachea at midline. No thyromegaly. No cervical lymphadenopathy. No masses.   HEART: Normal S1, S2.  No S3, S4. No murmur. No gallop. No carotid bruits.   RESPIRATORY: Normal breathing pattern. There is moderate prolongation of the expiratory phase. Scattered wheezing. No rhonchi. No rales. The patient is not using accessory muscles.   ABDOMEN: Soft without tenderness. No  hepatosplenomegaly. No masses. No hernias.   SKIN: No ulcers. No subcutaneous nodules.   MUSCULOSKELETAL: No joint swelling. No clubbing.   NEUROLOGIC: Cranial nerves II through XII were intact. No focal motor deficit.   PSYCHIATRIC: The patient is alert and oriented x3. Mood and affect were normal.   LABORATORY, DIAGNOSTIC, AND RADIOLOGICAL DATA: EKG showed normal sinus rhythm at rate of 68 per minute. Unremarkable EKG.  Chest x-ray showed no consolidation, no effusion. Heart size was normal. There were increased pulmonary markings.   Serum glucose 90, BUN 12, creatinine 0.7, sodium 143, potassium 4, calcium 8.9. Total CPK was 393. Troponin less than 0.02. CBC showed white count of 12,000, hemoglobin 14, hematocrit 44, platelet count 234.   ASSESSMENT:  1. Acute exacerbation of chronic obstructive pulmonary disease.  2. Tobacco abuse.  3. Systemic hypertension.  4. Chronic back pain.  5. Anxiety. 6. Recent admission about a month ago for TIA-like symptoms, also tachycardia, bradycardia syndrome suspected as well.   PLAN:  1. Will admit to the medical floor and intensify treatment with DuoNebs along with IV Solu-Medrol.  2. I will also place him on IV antibiotic using Rocephin 1 gram daily.  3. Will continue the rest of his home medications as listed above.  4. I advised the patient to quit smoking completely and he was counseled. He stated that he will further pursue that with his psychiatrist. He agreed to start on nicotine patch while he is here.   TIME SPENT EVALUATING THIS PATIENT: More than 55 minutes including reviewing his medical records.    ____________________________ Carney CornersAmir M. Rudene Rearwish, MD amd:drc D: 08/24/2011 05:47:17 ET T: 08/24/2011 08:12:51 ET JOB#: [id number]  cc: Carney CornersAmir M. Rudene Rearwish, MD, <Dictator> Hyman HopesHarriett P. Burns, MD Karolee OhsAMIR Dala DockM Fatin Bachicha MD ELECTRONICALLY SIGNED 08/24/2011 22:59

## 2014-06-20 NOTE — H&P (Signed)
PATIENT NAME:  Brandon, Moreno MR#:  [id number] DATE OF BIRTH:  09-24-1955  DATE OF ADMISSION:  07/19/2011  PRIMARY CARE PHYSICIAN: [name], MD   CHIEF COMPLAINT: Not feeling well.   HISTORY OF PRESENT ILLNESS: This is a 59 year old man who was here on Tuesday. His heart rate was racing at that time. He needed medications to slow it down. He was sent home on no medications after that. Since Tuesday he has been having problems with memory, very confused, talking out of his head. His left side feels like a light ache inside and numb outside, pain over his left eye, very difficult to get out of bed. Hospitalist services were contacted for further evaluation. In the ER, his urinalysis was negative, chest x-ray was negative, and CT scan of the head showed chronic sinus disease, no acute intracranial abnormality evident.   PAST MEDICAL HISTORY:  1. Chronic obstructive pulmonary disease.  2. Asthma.  3. Esophageal dilatation.  4. Chronic back pain. 5. Anxiety. 6. Hypertension. 7. Bradycardia. 8. Recent diagnosis of possible rheumatoid arthritis.   PAST SURGICAL HISTORY:  1. Stomach surgery. 2. Two back surgeries.  3. 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   ALLERGIES: Codeine, Levaquin, Lodine, Neurontin, Ultram.   MEDICATIONS:  1. Albuterol inhaler and nebulizer. 2. Advair Diskus 250/50 one inhalation twice a day. 3. Prilosec 40 mg daily.  4. Aspirin 81 mg daily.  5. Spiriva 1 inhalation daily.   SOCIAL HISTORY: Down to 1/2 pack per day, quit alcohol, no drug use, is on disability.   FAMILY HISTORY: Father died, had heart failure and stroke. Mother died of a stroke. Sister here in the CCU currently with a myocardial infarction. Sister with a pacer. Another sister with CABG. Another brother with CABG.    REVIEW OF SYSTEMS: CONSTITUTIONAL: Positive for fever. Positive for chills. Positive for weight loss. Positive for weakness on the left side.  EYES: Does have left-sided blurry vision and does wear reading glasses. EARS, NOSE, MOUTH, AND THROAT: Decreased hearing out of the left ear, hoarse voice. No difficulty swallowing. CARDIOVASCULAR: Positive for chest pain more on the upper chest and down the left arm. RESPIRATORY: Positive for shortness of breath. Positive for coughing up white phlegm. No hemoptysis. GASTROINTESTINAL: Positive for abdominal pain. No bright red blood per rectum. Had diarrhea on Tuesday. GENITOURINARY: No burning on urination or hematuria. MUSCULOSKELETAL: Positive for joint pain. INTEGUMENTARY: Positive for rash on the arms like blood spots coming under the skin. Positive for itching. NEUROLOGIC: Feels like he is going to faint. PSYCHIATRIC: No anxiety or depression. ENDOCRINE: No thyroid problems. HEMATOLOGIC/LYMPHATIC: No anemia.   PHYSICAL EXAMINATION:   VITAL SIGNS: Temperature 98, pulse ranging between 40 and 60, respirations 17, blood pressure 159/83, and pulse oximetry 95% on room air.   GENERAL: No respiratory distress.   EYES: Conjunctivae and lids normal. Pupils equal, round, and reactive to light. Extraocular muscles intact. No nystagmus.   EARS, NOSE, MOUTH, AND THROAT: Tympanic membranes no erythema. Nasal mucosa no erythema. Throat no erythema. No exudate seen. Lips and gums no lesions.   NECK: No JVD. No bruits. No lymphadenopathy. No thyromegaly. No thyroid nodules palpated.   LUNGS: Lungs are clear to auscultation. No use of accessory muscles to breathe. No rhonchi, rales, or wheeze heard.   HEART: S1 and S2 normal. No gallops, rubs, or murmurs heard. Carotid upstroke 2+ bilaterally. No bruits.   EXTREMITIES: Dorsalis pedis pulses 2+ bilaterally. No edema of  the lower extremities.  ABDOMEN: Soft. Slight tenderness in the left upper quadrant. No organomegaly/splenomegaly. Normoactive bowel sounds. No masses felt.   LYMPHATIC: No lymph nodes in the neck.   MUSCULOSKELETAL: No clubbing, edema, or  cyanosis.   SKIN: No ulcers seen and does look like some bruising on the arms.   NEUROLOGIC: Cranial nerves II through XII are grossly intact. Power 5/5 on the right upper and lower extremities, 4/5 on the left upper extremity and lower extremity. Deep tendon reflexes 1+ bilateral lower extremities.   PSYCHIATRIC: The patient is oriented to person, place, and time.   LABS/STUDIES: Urinalysis negative.   Ammonia 40. White blood cell count 7.0, hemoglobin and hematocrit 14.9 and 43.7, and platelet count 216. Glucose 127, BUN 10, creatinine 0.61, sodium 141, potassium 3.5, chloride 107, CO2 27, and calcium 8.7. Liver function tests: Albumin low at 3.0.   Chest x-ray: No pneumonia, possible atelectasis.   CT scan of the head showed chronic sinus disease, unchanged; mild atrophy.   EKG: Sinus bradycardia, 56 beats per minute, no acute ST-T wave changes.   ASSESSMENT AND PLAN:  1. Left-sided weakness - suspect cerebrovascular accident. We will get a MRI of the brain with and without contrast to rule out stroke and/or brain tumor. We will obtain an echocardiogram and carotid ultrasound. Since the patient is already on an aspirin we will take a step up to Aggrenox. We will get physical therapy and occupational therapy consultations. 2. Bradycardia with an episode of tachycardia - they said heart rate went up to 200. We will get a cardiology evaluation to evaluate for sick sinus syndrome, monitor on telemetry.  3. Chronic obstructive pulmonary disease - stable on medications.  4. Esophageal stricture - on Prilosec. 5. Back pain and rheumatoid arthritis - will have p.r.n. pain medications.  6. Tobacco abuse - smoking cessation counseling three minutes by me. Nicotine patch applied.  7. Impaired fasting glucose- we will check a Hemoglobin A1c.  TIME SPENT ON ADMISSION: 60 minutes.  ____________________________ Herschell Dimesichard J. Renae GlossWieting, MD rjw:slb D: 07/19/2011 22:22:29 ET T: 07/20/2011 08:05:00  ET JOB#: [id number]  cc: Herschell Dimesichard J. Renae GlossWieting, MD, <Dictator> Hyman HopesHarriett P. Burns, MD Salley ScarletICHARD J Braydee Shimkus MD ELECTRONICALLY SIGNED 07/29/2011 14:07

## 2014-06-20 NOTE — Consult Note (Signed)
Brief Consult Note: Diagnosis: mild sinus bradycardia.   Patient was seen by consultant.   Consult note dictated.   Comments: Lowest HR noted is 48 bpm on tele. No indication for pacemekaer at this time. However, if he develpos recurrent tachcyardiac in future requiring medications, he might need a PPM as a back up.  He will need outpatient follow up.  Electronic Signatures: Lorine BearsArida, Reisha Wos (MD)  (Signed [phone]) [id number]-May-13 12:31)  Authored: Brief Consult Note   Last Updated: 24-May-13 12:31 by Lorine BearsArida, [place] (MD)

## 2014-06-20 NOTE — Discharge Summary (Signed)
PATIENT NAME:  Brandon Moreno, Brandon Moreno MR#:  [id number] DATE OF BIRTH:  01-24-1956  DATE OF ADMISSION:  08/24/2011 DATE OF DISCHARGE:  08/28/2011  DISCHARGE DIAGNOSES:  1. Chronic obstructive pulmonary disease exacerbation, improving on steroids. Will need home oxygen.  2. Tachybrady syndrome, asymptomatic, will require outpatient cardiology follow up.  3. 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 Outpatient work-up.  4. Mood problems. Started on Aricept and Haldol by psychiatry and seemed to be helping, continued.  5. Hypoxia, likely due to chronic obstructive pulmonary disease.   SECONDARY DIAGNOSES:  1. Chronic obstructive pulmonary disease.  2. Hypertension.  3. Bradycardia.  4. Anxiety.   5. Chronic back pain.  6. History of esophageal dilatation.   CONSULTATION: Cardiology, Dr. Mariah MillingGollan.   LABORATORY, DIAGNOSTIC AND RADIOLOGICAL DATA: Chest x-ray on 28th of June showed chronic obstructive pulmonary disease and pulmonary fibrosis.   HISTORY AND SHORT HOSPITAL COURSE: Patient is a 59 year old male with above-mentioned medical problems was admitted for chronic obstructive pulmonary disease exacerbation, was started on IV steroids and nebulizer breathing treatment along with Advair, Spiriva. He was slowly improving. He did have some bradycardia with heart rate dropping in 50s to low 60s which was asymptomatic and cardiology evaluation was done by Dr. Mariah MillingGollan who recommended outpatient follow up and start him on aspirin and statin which was done. Patient was slowly improving. On 07/02 he was close to baseline and was discharged home in stable condition.   PHYSICAL EXAMINATION: VITAL SIGNS: On the date of discharge his vital signs were as follows: Temperature 97.8, heart rate 61 per minute, respirations 20 per minute, blood pressure 142/81 mmHg. He was saturating 92% on 2 liters oxygen via nasal cannula. CARDIOVASCULAR: S1, S2 normal. No murmur, rubs, gallop. LUNGS: Clear to auscultation bilaterally. No  wheezing, rales, rhonchi, crepitation. ABDOMEN: Soft, benign. NEUROLOGIC: Nonfocal examination. All of other physical examination remained at the baseline.   DISCHARGE MEDICATIONS:  1. Advair 250/50, 1 puff b.i.Moreno.  2. Ventolin 1 puff daily as needed. 3. Albuterol 3 mL inhaled 2 to 3 times a day as needed.  4. Omeprazole 40 mg p.o. daily. 5. Aggrenox 1 capsule p.o. b.i.Moreno.  6. Aricept 5 mg p.o. at bedtime.  7. Spiriva once daily. 8. Ambien 10 mg p.o. at bedtime.  9. Haldol 1 mg p.o. daily.  10. Aspirin 81 mg p.o. daily.  11. Theophylline 200 mg p.o. b.i.Moreno.  12. Nicotine patch transdermal daily.  13. Prednisone 60 mg p.o. daily, taper 10 mg daily until finished.   DISCHARGE DIET: Low sodium, 1800 ADA.    DISCHARGE ACTIVITY: As tolerated.   DISCHARGE INSTRUCTIONS AND FOLLOW UP:  1. Patient was set up to get 2 liters oxygen via nasal cannula continuous but mainly on exertion due to hypoxia.  2. He will need follow up with his primary care physician, Dr. Fulton MoleHarriett Burns, in 1 to 2 weeks, with [facility] in 2 to 4 weeks (Dr. Kendrick FriesMcQuaid) [facility], Dr. Jearl KlinefelterGollan/Dr. Kirke CorinArida in 4 to 6 weeks.  3. He was also recommended to follow up with pain management clinic here at [facility] with Dr. Ewing SchleinGregory Crisp in six weeks.       TOTAL TIME DISCHARGING THIS PATIENT: 55 minutes.  ____________________________ Ellamae SiaVipul S. Sherryll BurgerShah, MD vss:cms Moreno: 08/29/2011 08:44:35 ET T: 08/29/2011 14:47:56 ET JOB#: [id number]  cc: Kayhan Boardley S. Sherryll BurgerShah, MD, <Dictator> Hyman HopesHarriett P. Burns, MD Ancil LinseyGregory H. Crisp, MD Antonieta Ibaimothy J. Gollan, MD Lupita Leashouglas B. McQuaid, MD Ellamae SiaVIPUL S [facility] Center[facility] MD ELECTRONICALLY SIGNED 08/31/2011 19:45

## 2014-06-20 NOTE — Consult Note (Signed)
PATIENT NAME:  Brandon Moreno, Brandon Moreno MR#:  [id number] DATE OF BIRTH:  April 21, 1955  DATE OF CONSULTATION:  07/20/2011  REFERRING PHYSICIAN:  Herschell Dimesichard J. Renae GlossWieting, MD CONSULTING PHYSICIAN:  Minahil Quinlivan A. Kirke CorinArida, MD  REASON FOR CONSULTATION: Bradycardia.   HISTORY OF PRESENT ILLNESS: This is a [age] year old gentleman with past medical history of chronic obstructive pulmonary disease, tobacco use, hypertension, chronic back pain and esophageal dilatation. It appears that he was here in the Emergency Room on Tuesday when he was noted to be tachycardic. He was given a medication for that and was discharged home. The emergency department record is not available for review.  Since that time he had memory problems with confusion as well as left-sided numbness and weakness. The patient is suspected of having a stroke. He mentioned that he had tachycardia in the past, but it appears that he does not have an official arrhythmia diagnosis. He has been feeling dizzy and lightheaded, but there has been no syncope with the current bradycardia. He denies any chest pain. He has chronic dyspnea related to his chronic obstructive pulmonary disease. He denies any drug use. However, he tested positive for cannabinoids, but he is on Prilosec. His wife mentioned that he does use marijuana occasionally in order to improve his stomach discomfort.   PAST MEDICAL HISTORY: 1. Chronic obstructive pulmonary disease. 2. Esophageal stricture status post dilatation. 3. Chronic back pain. 4. Anxiety. 5. Hypertension. 6. History of tachycardia and bradycardia.   PAST SURGICAL HISTORY: Stomach surgery, back surgery and left foot surgery.   ALLERGIES: Codeine, Levaquin, Lodine, Neurontin and Ultram.  HOME MEDICATIONS: 1. Albuterol inhaler. 2. Advair Diskus 250/50 twice daily. 3. Prilosec 40 mg daily. 4. Aspirin 81 mg daily. 5. Spiriva 1 inhalation once daily.   SOCIAL HISTORY: Remarkable for smoking half a pack per day. He had quit  alcohol. He does use marijuana occasionally.   FAMILY HISTORY: His father died of heart failure and stroke. Mother died of stroke. There is a family history of premature coronary artery disease.   REVIEW OF SYSTEMS: A 10-point review of systems was performed. It is negative other than what is mentioned in the history of present illness.   PHYSICAL EXAMINATION: GENERAL: The patient appears to be older than his stated age but in no acute distress.   VITAL SIGNS: Temperature is 98, pulse is 51, respiratory rate is 20, blood pressure is 159/75, oxygen saturation is 96% on room air.   HEENT: Normocephalic, atraumatic.   NECK: No jugular venous distention or carotid bruits.   RESPIRATORY: Normal respiratory effort with no use of accessory muscles. Auscultation reveals normal breath sounds.   CARDIOVASCULAR: Normal PMI. Normal S1 and S2 with no gallops or murmurs.   ABDOMEN: Benign, nontender, and nondistended.   EXTREMITIES: No clubbing, cyanosis, or edema.   SKIN: Warm and dry with no rash.   PSYCHIATRIC: He is alert, oriented x3 with normal mood and affect.   LABORATORY AND DIAGNOSTIC DATA: His labs showed normal renal function. Cardiac enzymes are negative. CBC is unremarkable. ECG shows sinus bradycardia with no significant ST or T wave changes.   IMPRESSIONS: 1. Mild sinus bradycardia, which seems to be minimally symptomatic. 2. History of unspecified tachycardia without documented arrhythmia. 3. Possible cerebrovascular accident. 4. Hypertension. 5. Chronic obstructive pulmonary disease.   RECOMMENDATIONS: The patient currently has mild sinus bradycardia. I do not see any heart rate less than 48 beats per minute on telemetry. There is no evidence of high grade AV block. Currently there  is no indication for a permanent pacemaker. I recommend avoiding all medications that can cause bradycardia. His tachycardia history is not clear to me. It might be beneficial to evaluate the  patient with an outpatient telemetry to see if he truly has any tachyarrhythmia or whether it is just sinus tachycardia in the setting of uncontrolled discomfort. If the patient proves in the future that he has evidence of tachyarrhythmia that might require medications, in that situation he might require a permanent pacemaker as a back-up while he is on medications. The patient should follow up with [place] in the outpatient setting for further evaluation. His echocardiogram was overall unremarkable with normal LV systolic function and no significant valvular abnormalities.     ____________________________ Chelsea Aus Kirke Corin, MD maa:kma Moreno: 07/20/2011 12:40:03 ET T: 07/21/2011 09:53:42 ET JOB#: [id number]  cc: [place]ath[place] Every A. Kirke Corin, MD, <Dictator> [place] Ouch MD ELECTRONICALLY SIGNED 08/07/2011 13:08

## 2014-06-27 NOTE — H&P (Signed)
PATIENT NAME:  EDMUND, HOLCOMB MR#:  [id number] DATE OF BIRTH:  30-Jan-1956  DATE OF ADMISSION:  04/25/2014  PRIMARY CARE PHYSICIAN: Hyman Hopes, MD  REFERRING PHYSICIAN: Cecille Amsterdam. Mayford Knife, MD  CHIEF COMPLAINT: Worsening shortness of breath for 3 days.   HISTORY OF PRESENT ILLNESS: A 59 year old male with a history of COPD, atrial fibrillation, was sent from home due to worsening shortness of breath for the past 3 days. The patient is alert, awake, oriented, but has dementia. He only complains of shortness of breath, cough, and wheezing. He denies any other symptoms. According to patient's wife, the patient was in hospice care. He has chronic shortness of breath, but shortness of breath has been worsening for the past 3 days. In addition, the patient has a cough with sputum and wheezing. Also, he also has a headache; some fever, chills. The patient was treated with nebulizer and IV steroid in ED and admitted for COPD exacerbation.   PAST MEDICAL HISTORY: Atrial fibrillation with RVR, COPD, chronic respiratory failure, pneumonia, hypertension, TIA, chronic anticoagulation with Xarelto, anxiety disorder, history of psychosis, chronic back pain, possible abuse of narcotics, arthritis.   PAST SURGICAL HISTORY: History of esophageal dilatation, back surgery twice, left foot surgery, and stomach surgery.   SOCIAL HISTORY: The patient is a chronic smoker. He used to smoke 2 to 3 packs a day, but he said he is smoking a half pack a day. Denies any alcohol drinking or illicit drugs.   FAMILY HISTORY: Father died from complications of CHF and stroke. Mother had a stroke. Two sisters have CAD and underwent CABG. One sister has a pacemaker.   ALLERGIES: CODEINE, LEVAQUIN, LODINE, NEURONTIN, ULTRAM.   HOME MEDICATIONS: Xarelto 20 mg p.o. daily in the evening, trazodone 50 mg p.o. at bedtime, theophylline 200 mg every 12 hours p.o., pravastatin 40 mg p.o. at bedtime, Percocet 10/325 mg p.o. 2 to 3  times a day, omeprazole 20 mg p.o. 2 caps once a day, metformin 500 mg p.o. daily, Haldol 1 mg p.o. 2 tablets 3 times a day, DuoNeb 3 mL inhaled every 6 hours, donepezil 10 mg p.o. at bedtime, Cardizem 30 mg p.o. 4 times a day.  REVIEW OF SYSTEMS:  CONSTITUTIONAL: The patient denies any fever or chills. Has headache. No dizziness or weakness.  EYES: No double vision or blurred vision.  EARS, NOSE, THROAT: No postnasal drip, slurred speech, or dysphagia.  CARDIOVASCULAR: No chest pain, palpitation, orthopnea, or nocturnal dyspnea. No leg edema.  PULMONARY: Positive for cough, sputum, shortness of breath, and wheezing. No hemoptysis.  GASTROINTESTINAL: No abdominal pain, nausea, vomiting, diarrhea. No melena or bloody stool.  GENITOURINARY: No dysuria, hematuria, or incontinence.  SKIN: No rash or jaundice.  NEUROLOGY: No syncope, loss of consciousness, or seizure.  HEMATOLOGY: No easy bruising or bleeding.  ENDOCRINOLOGY: No polyuria, polydipsia, heat or cold intolerance.   PHYSICAL EXAMINATION:  VITAL SIGNS: Temperature 98.6, blood pressure 139/78, pulse 74, oxygen saturation 94% on oxygen by nasal cannula, respirations 18.  PHYSICAL EXAMINATION: The patient is alert, awake, oriented, in no acute distress.  HEENT: Pupils round, equal, reactive to light and accommodation. Moist oral mucosa. Clear oropharynx.  NECK: Supple. No JVD or carotid bruit. No lymphadenopathy. No thyromegaly.  CARDIOVASCULAR: S1 and S2. Regular rate, rhythm. No murmurs or gallops.  PULMONARY: Bilateral air entry. Bilateral expiratory wheezing and rhonchi. No crackles or rales.  ABDOMEN: Soft. No distention or tenderness. No organomegaly. Bowel sounds present.  EXTREMITIES: No edema, clubbing, or cyanosis.  No calf tenderness. Bilateral pedal pulses present.  SKIN: No rash or jaundice.  NEUROLOGIC: A and O x 3. No focal deficit. Power 5/5. Sensation intact.   LABORATORY DATA: Venous ABG showed pH of 7.38, pCO2 of 48.  Chest x-ray: Findings of COPD with chronic interstitial prominence and peribronchial thickening. No acute cardiopulmonary process. BNP 102. CBC in normal range. Glucose 104, BUN 4, creatinine 0.75. Electrolytes normal. Troponin less than 0.02. EKG shows sinus rhythm at 82 BPM with a first-degree AV block, incomplete right bundle branch block.   IMPRESSIONS:  1.  Acute respiratory failure with oxygen saturation at 83% on room air.  2.  Chronic obstructive pulmonary disease exacerbation.  3.  Hypertension.  4.  Tobacco abuse.   PLAN OF TREATMENT:  1.  The patient will be admitted to medical floor. We will continue oxygen by nasal cannula. Start DuoNeb, IV Solu-Medrol. In addition, I will give Zithromax.  2.  For history of atrial fibrillation, will continue Xarelto.  3.  For tobacco abuse, the patient was counseled for smoking cessation for about 3 to 4 minutes. We will give a nicotine patch.   I discussed the patient's condition and plan of treatment with the patient and the patient's wife.  CODE STATUS: The patient wants full code.   TIME SPENT: About 63 minutes.    ____________________________ Shaune PollackQing Lavaughn Bisig, MD qc:ST D: 04/25/2014 21:35:04 ET T: 04/25/2014 22:35:03 ET JOB#: [id number]  cc: Shaune PollackQing Callen Vancuren, MD, <Dictator> Shaune PollackQING Linden Tagliaferro MD ELECTRONICALLY SIGNED 04/26/2014 18:12

## 2014-06-27 NOTE — Discharge Summary (Signed)
PATIENT NAME:  Brandon Moreno, Brandon Moreno MR#:  [id number] DATE OF BIRTH:  1955-07-09  DATE OF ADMISSION:  04/25/2014 DATE OF DISCHARGE:  04/29/2014  ADMITTING PHYSICIAN: Shaune PollackQing Chen, MD   DISCHARGING PHYSICIAN: Enid Baasadhika Shirlee Whitmire, MD   PRIMARY CARE PHYSICIAN: Hyman HopesHarriett P. Burns, MD   CONSULTATIONS IN THE HOSPITAL: Palliative care consultation by Dr. Harriett SineNancy Phifer.   DISCHARGE DIAGNOSES:  1.  Acute on chronic respiratory failure.  2.  Chronic respiratory failure secondary to chronic obstructive pulmonary disease, on home oxygen.  3.  History of atrial fibrillation.  4.  Hypertension.  5.  Anxiety and history of psychosis.  6.  Early dementia.  7.  Chronic back pain.  8.  Persistent hiccups.  9.  Tobacco use disorder.   DISCHARGE MEDICATIONS:  1.  Haldol 1 mg 2 tablets 3 times a day.  2.  Roxanol 20 mg/mL concentrate 0.25 mL every 1-2 hours as needed for pain, dyspnea.  3.  Ativan 0.5 mg 1 to 2 tablets sublingual or oral every 2-4 hours as needed for agitation and anxiety.   ACTIVITY LEVEL: As tolerated.   OXYGEN: Discharged on oxygen as needed.   LABORATORIES AND IMAGING STUDIES PRIOR TO DISCHARGE:  1.  Sodium 138, potassium 4.2, chloride 100, bicarbonate 30, BUN 14, creatinine 0.83, glucose 131, and calcium of 8.7.  2.  Chest x-ray showing bibasilar densities could be atelectasis. Mildly hyperinflated lung fields seen.  3.  Abdominal ultrasound showing normal liver, gallbladder, pancreas, spleen, and kidneys. No acute disease noted.  4.  Urinalysis negative for any infection.  5.  WBC 7.2, hemoglobin 14.3, hematocrit 43.4, platelet count 181,000.   BRIEF HOSPITAL COURSE: Mr. Brandon Moreno is a 59 year old unfortunate male with past medical history significant for COPD on home oxygen, atrial fibrillation on Xarelto, dementia, psychosis, anxiety disorder, who presented to the hospital secondary to worsening shortness of breath.  1.  Acute on chronic respiratory failure secondary to COPD exacerbation.  Started on steroids, nebulizers, inhalers, and azithromycin. The patient was followed by hospice in the past and was released from their services. Wife unable to at home and they were trying to get the patient to a rehab with the help of PCP. During the hospital course, the patient decompensated and became hypoxic requiring 100% nonrebreather mask, and family decided that he was appropriate to go to hospice home. Palliative care consult was requested, and the patient is being discharged to hospice home.  2.  Intractable hiccups. Started on Thorazine, did not help him much, so changed over to baclofen  at this time.    All his other home medications are being discontinued and patient is to place on comfort medications and is being transferred to hospice home.   CODE STATUS: DO NOT RESUSCITATE.   TIME SPENT DISCHARGE: 40 minutes.   ____________________________ Enid Baasadhika Oreatha Fabry, MD rk:bm Moreno: 04/29/2014 15:09:58 ET T: 04/29/2014 23:01:34 ET JOB#: [id number]  cc: Enid Baasadhika Ilamae Geng, MD, <Dictator> Hyman HopesHarriett P. Burns, MD Enid BaasADHIKA Eyad Rochford MD ELECTRONICALLY SIGNED 25-Apr-2014 18:06

## 2014-06-27 NOTE — Consult Note (Signed)
   Comments   Follow up visit made. Pt increasingly hypoxic this afternoon and is now on NRB. Pt appears very tenuous and lethargic. Case discussed with attending. I called and update pt's wife who recognizes that pt is not doing well. She would like to continue present care overnight but then consider transferring him to the Hospice Home in the AM if he is not improved. She would like to ensure that he is comfortable overnight. Will order PRN morphine and lorazepam.  Electronic Signatures: Kassady Laboy, Daryl EasternJoshua R (NP)  (Signed 02-Mar-16 16:09)  Authored: Palliative Care Phifer, Harriett SineNancy (MD)  (Signed 02-Mar-16 17:36)  Authored: Palliative Care   Last Updated: 02-Mar-16 17:36 by Phifer, Harriett SineNancy (MD)
